# Patient Record
Sex: Female | Born: 1975 | Race: Black or African American | Hispanic: No | Marital: Single | Smoking: Never smoker
Health system: Southern US, Community
[De-identification: ages and names within clinical notes are randomized; demographics above are authoritative.]

## PROBLEM LIST (undated history)

## (undated) ENCOUNTER — Inpatient Hospital Stay (HOSPITAL_COMMUNITY): Payer: Self-pay

## (undated) DIAGNOSIS — Z789 Other specified health status: Secondary | ICD-10-CM

## (undated) HISTORY — PX: NO PAST SURGERIES: SHX2092

---

## 2001-04-11 ENCOUNTER — Other Ambulatory Visit: Admission: RE | Admit: 2001-04-11 | Discharge: 2001-04-11 | Payer: Self-pay | Admitting: Obstetrics and Gynecology

## 2001-11-17 ENCOUNTER — Encounter: Payer: Self-pay | Admitting: Obstetrics and Gynecology

## 2001-11-17 ENCOUNTER — Ambulatory Visit (HOSPITAL_COMMUNITY): Admission: RE | Admit: 2001-11-17 | Discharge: 2001-11-17 | Payer: Self-pay | Admitting: Obstetrics and Gynecology

## 2002-02-02 ENCOUNTER — Encounter: Payer: Self-pay | Admitting: Obstetrics and Gynecology

## 2002-02-02 ENCOUNTER — Ambulatory Visit (HOSPITAL_COMMUNITY): Admission: RE | Admit: 2002-02-02 | Discharge: 2002-02-02 | Payer: Self-pay | Admitting: Obstetrics and Gynecology

## 2002-02-21 ENCOUNTER — Inpatient Hospital Stay (HOSPITAL_COMMUNITY): Admission: AD | Admit: 2002-02-21 | Discharge: 2002-02-21 | Payer: Self-pay | Admitting: Obstetrics and Gynecology

## 2002-03-14 ENCOUNTER — Inpatient Hospital Stay (HOSPITAL_COMMUNITY): Admission: AD | Admit: 2002-03-14 | Discharge: 2002-03-16 | Payer: Self-pay | Admitting: Obstetrics and Gynecology

## 2002-04-03 ENCOUNTER — Encounter: Admission: RE | Admit: 2002-04-03 | Discharge: 2002-05-03 | Payer: Self-pay | Admitting: Obstetrics and Gynecology

## 2002-05-11 ENCOUNTER — Other Ambulatory Visit: Admission: RE | Admit: 2002-05-11 | Discharge: 2002-05-11 | Payer: Self-pay | Admitting: Obstetrics and Gynecology

## 2003-01-15 ENCOUNTER — Emergency Department (HOSPITAL_COMMUNITY): Admission: EM | Admit: 2003-01-15 | Discharge: 2003-01-15 | Payer: Self-pay

## 2004-02-28 ENCOUNTER — Inpatient Hospital Stay (HOSPITAL_COMMUNITY): Admission: AD | Admit: 2004-02-28 | Discharge: 2004-02-28 | Payer: Self-pay | Admitting: Obstetrics & Gynecology

## 2005-09-09 ENCOUNTER — Ambulatory Visit: Payer: Self-pay | Admitting: Internal Medicine

## 2005-10-29 ENCOUNTER — Ambulatory Visit: Payer: Self-pay | Admitting: Family Medicine

## 2005-11-05 ENCOUNTER — Encounter: Payer: Self-pay | Admitting: Family Medicine

## 2005-11-05 ENCOUNTER — Ambulatory Visit: Payer: Self-pay | Admitting: Family Medicine

## 2007-01-17 ENCOUNTER — Encounter (INDEPENDENT_AMBULATORY_CARE_PROVIDER_SITE_OTHER): Payer: Self-pay | Admitting: Family Medicine

## 2007-01-17 ENCOUNTER — Ambulatory Visit: Payer: Self-pay | Admitting: Family Medicine

## 2007-01-17 DIAGNOSIS — N72 Inflammatory disease of cervix uteri: Secondary | ICD-10-CM | POA: Insufficient documentation

## 2007-01-18 ENCOUNTER — Ambulatory Visit: Payer: Self-pay | Admitting: Family Medicine

## 2007-04-11 ENCOUNTER — Ambulatory Visit: Payer: Self-pay | Admitting: Family Medicine

## 2007-06-02 ENCOUNTER — Ambulatory Visit: Payer: Self-pay | Admitting: Family Medicine

## 2007-06-28 ENCOUNTER — Encounter (INDEPENDENT_AMBULATORY_CARE_PROVIDER_SITE_OTHER): Payer: Self-pay | Admitting: *Deleted

## 2007-07-18 ENCOUNTER — Ambulatory Visit: Payer: Self-pay | Admitting: Family Medicine

## 2007-07-18 LAB — CONVERTED CEMR LAB: Preg, Serum: NEGATIVE

## 2007-07-19 ENCOUNTER — Ambulatory Visit: Payer: Self-pay | Admitting: Family Medicine

## 2007-10-02 ENCOUNTER — Encounter (INDEPENDENT_AMBULATORY_CARE_PROVIDER_SITE_OTHER): Payer: Self-pay | Admitting: Family Medicine

## 2007-10-10 ENCOUNTER — Ambulatory Visit: Payer: Self-pay | Admitting: Family Medicine

## 2008-01-01 ENCOUNTER — Ambulatory Visit: Payer: Self-pay | Admitting: Nurse Practitioner

## 2008-02-19 ENCOUNTER — Encounter: Admission: RE | Admit: 2008-02-19 | Discharge: 2008-02-19 | Payer: Self-pay | Admitting: Occupational Medicine

## 2008-03-25 ENCOUNTER — Ambulatory Visit: Payer: Self-pay | Admitting: Family Medicine

## 2008-03-25 ENCOUNTER — Encounter (INDEPENDENT_AMBULATORY_CARE_PROVIDER_SITE_OTHER): Payer: Self-pay | Admitting: Family Medicine

## 2008-03-25 LAB — CONVERTED CEMR LAB
Bilirubin Urine: NEGATIVE
Blood in Urine, dipstick: NEGATIVE
Glucose, Urine, Semiquant: NEGATIVE
Ketones, urine, test strip: NEGATIVE
Nitrite: NEGATIVE
Protein, U semiquant: 30
Specific Gravity, Urine: >=1.03
Urobilinogen, UA: 1
WBC Urine, dipstick: NEGATIVE
pH: 6

## 2008-04-03 ENCOUNTER — Encounter (INDEPENDENT_AMBULATORY_CARE_PROVIDER_SITE_OTHER): Payer: Self-pay | Admitting: Family Medicine

## 2008-07-01 ENCOUNTER — Ambulatory Visit: Payer: Self-pay | Admitting: Family Medicine

## 2008-07-01 LAB — CONVERTED CEMR LAB: Preg, Serum: NEGATIVE

## 2008-07-02 ENCOUNTER — Ambulatory Visit: Payer: Self-pay | Admitting: Family Medicine

## 2008-09-23 ENCOUNTER — Ambulatory Visit: Payer: Self-pay | Admitting: Family Medicine

## 2008-12-20 ENCOUNTER — Ambulatory Visit: Payer: Self-pay | Admitting: Nurse Practitioner

## 2008-12-20 LAB — CONVERTED CEMR LAB: Beta hcg, urine, semiquantitative: NEGATIVE

## 2009-03-31 ENCOUNTER — Ambulatory Visit: Payer: Self-pay | Admitting: Nurse Practitioner

## 2009-03-31 ENCOUNTER — Encounter (INDEPENDENT_AMBULATORY_CARE_PROVIDER_SITE_OTHER): Payer: Self-pay | Admitting: Family Medicine

## 2009-03-31 LAB — CONVERTED CEMR LAB: Preg, Serum: NEGATIVE

## 2009-04-01 ENCOUNTER — Ambulatory Visit: Payer: Self-pay | Admitting: Nurse Practitioner

## 2009-06-23 ENCOUNTER — Ambulatory Visit: Payer: Self-pay | Admitting: Internal Medicine

## 2009-09-15 ENCOUNTER — Ambulatory Visit: Payer: Self-pay | Admitting: Physician Assistant

## 2009-12-08 ENCOUNTER — Ambulatory Visit: Payer: Self-pay | Admitting: Physician Assistant

## 2010-03-02 ENCOUNTER — Ambulatory Visit: Payer: Self-pay | Admitting: Nurse Practitioner

## 2010-03-02 DIAGNOSIS — E6609 Other obesity due to excess calories: Secondary | ICD-10-CM | POA: Insufficient documentation

## 2010-03-02 DIAGNOSIS — E669 Obesity, unspecified: Secondary | ICD-10-CM

## 2010-03-02 LAB — CONVERTED CEMR LAB
ALT: 17 units/L (ref 0–35)
AST: 16 units/L (ref 0–37)
Albumin: 4.3 g/dL (ref 3.5–5.2)
Alkaline Phosphatase: 93 units/L (ref 39–117)
BUN: 17 mg/dL (ref 6–23)
Basophils Absolute: 0.1 10*3/uL (ref 0.0–0.1)
Basophils Relative: 1 % (ref 0–1)
Bilirubin Urine: NEGATIVE
CO2: 27 meq/L (ref 19–32)
Calcium: 9.2 mg/dL (ref 8.4–10.5)
Chlamydia, DNA Probe: NEGATIVE
Chloride: 104 meq/L (ref 96–112)
Creatinine, Ser: 1.05 mg/dL (ref 0.40–1.20)
Eosinophils Absolute: 0.1 10*3/uL (ref 0.0–0.7)
Eosinophils Relative: 3 % (ref 0–5)
GC Probe Amp, Genital: NEGATIVE
Glucose, Bld: 79 mg/dL (ref 70–99)
Glucose, Urine, Semiquant: NEGATIVE
HCT: 41.9 % (ref 36.0–46.0)
Hemoglobin: 13.6 g/dL (ref 12.0–15.0)
KOH Prep: NEGATIVE
Ketones, urine, test strip: NEGATIVE
Lymphocytes Relative: 45 % (ref 12–46)
Lymphs Abs: 2.2 10*3/uL (ref 0.7–4.0)
MCHC: 32.5 g/dL (ref 30.0–36.0)
MCV: 94.8 fL (ref 78.0–100.0)
Monocytes Absolute: 0.3 10*3/uL (ref 0.1–1.0)
Monocytes Relative: 6 % (ref 3–12)
Neutro Abs: 2.2 10*3/uL (ref 1.7–7.7)
Neutrophils Relative %: 45 % (ref 43–77)
Nitrite: NEGATIVE
Platelets: 343 10*3/uL (ref 150–400)
Potassium: 4.6 meq/L (ref 3.5–5.3)
Protein, U semiquant: NEGATIVE
RBC: 4.42 M/uL (ref 3.87–5.11)
RDW: 13.1 % (ref 11.5–15.5)
Rapid HIV Screen: NEGATIVE
Sodium: 140 meq/L (ref 135–145)
Specific Gravity, Urine: 1.015
TSH: 3.201 microintl units/mL (ref 0.350–4.500)
Total Bilirubin: 0.4 mg/dL (ref 0.3–1.2)
Total Protein: 6.7 g/dL (ref 6.0–8.3)
Urobilinogen, UA: 1
WBC Urine, dipstick: NEGATIVE
WBC: 4.8 10*3/uL (ref 4.0–10.5)
pH: 7

## 2010-03-03 ENCOUNTER — Encounter (INDEPENDENT_AMBULATORY_CARE_PROVIDER_SITE_OTHER): Payer: Self-pay | Admitting: Nurse Practitioner

## 2010-03-10 LAB — CONVERTED CEMR LAB: Pap Smear: NEGATIVE

## 2010-03-20 ENCOUNTER — Ambulatory Visit: Payer: Self-pay | Admitting: Nurse Practitioner

## 2010-03-20 LAB — CONVERTED CEMR LAB
LDL Cholesterol: 131 mg/dL — ABNORMAL HIGH (ref 0–99)
Triglycerides: 42 mg/dL (ref <150)

## 2010-03-23 ENCOUNTER — Encounter (INDEPENDENT_AMBULATORY_CARE_PROVIDER_SITE_OTHER): Payer: Self-pay | Admitting: Nurse Practitioner

## 2010-05-25 ENCOUNTER — Encounter (INDEPENDENT_AMBULATORY_CARE_PROVIDER_SITE_OTHER): Payer: Self-pay | Admitting: *Deleted

## 2010-05-25 ENCOUNTER — Telehealth (INDEPENDENT_AMBULATORY_CARE_PROVIDER_SITE_OTHER): Payer: Self-pay | Admitting: *Deleted

## 2010-06-24 ENCOUNTER — Telehealth (INDEPENDENT_AMBULATORY_CARE_PROVIDER_SITE_OTHER): Payer: Self-pay | Admitting: Nurse Practitioner

## 2010-06-30 ENCOUNTER — Encounter (INDEPENDENT_AMBULATORY_CARE_PROVIDER_SITE_OTHER): Payer: Self-pay | Admitting: Internal Medicine

## 2010-08-06 ENCOUNTER — Ambulatory Visit: Payer: Self-pay | Admitting: Nurse Practitioner

## 2010-08-06 LAB — CONVERTED CEMR LAB: Preg, Serum: NEGATIVE

## 2010-08-07 ENCOUNTER — Ambulatory Visit: Payer: Self-pay | Admitting: Nurse Practitioner

## 2010-10-30 ENCOUNTER — Ambulatory Visit
Admission: RE | Admit: 2010-10-30 | Discharge: 2010-10-30 | Payer: Self-pay | Source: Home / Self Care | Attending: Nurse Practitioner | Admitting: Nurse Practitioner

## 2010-11-10 NOTE — Letter (Signed)
Summary: Lipid Letter  [facility]  [address] [place], [place] [address]   Phone: [phone]  Fax: [phone]    03/23/2010  Janet Garrison [address] [place], [place]  [address]  Dear Debbora Presto:  We have carefully reviewed your last lipid profile from 03/20/2010 and the results are noted below with a summary of recommendations for lipid management.    Cholesterol:       175     Goal: less than 200   HDL "good" Cholesterol:   36     Goal: greater than 40   LDL "bad" Cholesterol:   131     Goal: less than 130   Triglycerides:       42     Goal: less than 150  Cholesterol labs ok at this time.    Current Medications: 1)    Naprosyn 500 Mg  Tabs (Naproxen) .... As needed 2)    Depo-provera 150 Mg/ml Im Susp (Medroxyprogesterone acetate)  If you have any questions, please call. We appreciate being able to work with you.   Sincerely,    [facility] Lehman Prom FNP

## 2010-11-10 NOTE — Progress Notes (Signed)
Summary: Office Visit//DEPRESSION SCREENING  Office Visit//DEPRESSION SCREENING   Imported By: Arta Bruce 04/20/2010 11:55:50  _____________________________________________________________________  External Attachment:    Type:   Image     Comment:   External Document

## 2010-11-10 NOTE — Letter (Signed)
Summary: *HSN Results Follow up  [facility]  [address] [place], [place] [address]   Phone: [phone]  Fax: [phone]      03/03/2010   [facility] [address] [place], [place]  [address]   Dear  Ms. GRENDELL Sterry,                            ____S.Drinkard,FNP   ____D. Gore,FNP       ____B. McPherson,MD   ____V. Rankins,MD    ____E. Mulberry,MD    _X___N. Daphine Deutscher, FNP  ____D. Reche Dixon, MD    ____K. Philipp Deputy, MD    ____Other     This letter is to inform you that your recent test(s):  ___X____Pap Smear    __X_____Lab Test     _______X-ray    ____X___ is within acceptable limits  _______ requires a medication change  _______ requires a follow-up lab visit  _______ requires a follow-up visit with your provider   Comments: Labs done during recent office visit are normal.  Pap Smear results ______________________________.       _________________________________________________________ If you have any questions, please contact our office [phone].                    Sincerely,    Lehman Prom FNP [facility]

## 2010-11-10 NOTE — Assessment & Plan Note (Signed)
Summary: DEPO-PROVERA////RJP   Nurse Visit   Allergies: No Known Drug Allergies  Medication Administration  Injection # 1:    Medication: Depo-Provera 150mg     Diagnosis: CONTRACEPTIVE MANAGEMENT (ICD-V25.09)    Route: IM    Site: L deltoid    Exp Date: 03/10/2012    Lot #: [id number]    Mfr: Pharmacia    Comments: [phone] Next shot due 03/02/10    Patient tolerated injection without complications    Given by: Armenia Shannon (December 08, 2009 2:49 PM)  Orders Added: 1)  Est. Patient Nurse visit [09003] 2)  Depo-Provera 150mg  [J1055] 3)  Admin of Therapeutic Inj  intramuscular or subcutaneous [96372]   Medication Administration  Injection # 1:    Medication: Depo-Provera 150mg     Diagnosis: CONTRACEPTIVE MANAGEMENT (ICD-V25.09)    Route: IM    Site: L deltoid    Exp Date: 03/10/2012    Lot #: [id number]    Mfr: Pharmacia    Comments: [phone] Next shot due 03/02/10    Patient tolerated injection without complications    Given by: Armenia Shannon (December 08, 2009 2:49 PM)  Orders Added: 1)  Est. Patient Nurse visit [09003] 2)  Depo-Provera 150mg  [J1055] 3)  Admin of Therapeutic Inj  intramuscular or subcutaneous [30865]

## 2010-11-10 NOTE — Letter (Signed)
Summary: Generic Letter  [facility]  [address] [place], [place] [address]   Phone: [phone]  Fax: [phone]        06/30/2010  [facility] [address] [place], [place]  [address]  Dear Ms. Bordeaux,  We have been unable to contact you by telephone.  Please contact our office, at your earliest convenience, so that we may speak with you.  Sincerely,   Dutch Quint RN

## 2010-11-10 NOTE — Progress Notes (Signed)
Summary: Wants information on becoming a surrogate mother  Phone Note Call from Patient Call back at [phone]   Caller: Patient Reason for Call: Talk to Doctor Summary of Call: PT WANTS TO SPEAK TO  DOCTOR ABOUT BECOMING A SERAGENT MOTHER.   Initial call taken by: Oscar La,  June 24, 2010 11:20 AM  Follow-up for Phone Call        [facility] for add'l info Michelle Nasuti  June 24, 2010 12:37 PM   Left message on answering machine for pt. to return call.  Dutch Quint RN  June 25, 2010 12:40 PM  Left message on answering machine for pt. to return call.  Dutch Quint RN  June 29, 2010 12:34 PM  Letter sent.  Dutch Quint RN  June 30, 2010 3:54 PM

## 2010-11-10 NOTE — Assessment & Plan Note (Signed)
Summary: Complete Physical Exam   Vital Signs:  Patient profile:   35 year old female Menstrual status:  DEPO Weight:      189.7 pounds BMI:     35.97 BSA:     1.85 Temp:     98.0 degrees F oral Pulse rate:   91 / minute Pulse rhythm:   regular Resp:     20 per minute BP sitting:   131 / 87  (left arm) Cuff size:   regular  Vitals Entered By: Levon Hedger (Mar 02, 2010 10:55 AM) CC: CPP Is Patient Diabetic? No Pain Assessment Patient in pain? no       Does patient need assistance? Functional Status Self care Ambulation Normal     Menstrual Status DEPO Last PAP Result normal   CC:  CPP.  History of Present Illness:  Pt into the office for a complete physical exam  PAP - last done 2 years ago.  no family history of cervical cancer. currently on depoprovera - been on in the past 8 years Denies any smoking No elevation in BP some weight gain Menses - only minimal spotting but no real menses 68 child - 46 years old and pt is not interested in any other children  Mammogram - no family history of breast cancer never had mammogram  Optho - no glasses or contacts  Dental - dental work done in 11/2009 - fillings.  She has another appt in July for more fillings.  tdap - up to date.  Obesity - pt has concerns about her weight. Pt does know that her weight is of concern.   No meaningful exercise  Habits & Providers  Alcohol-Tobacco-Diet     Alcohol drinks/day: 0     Tobacco Status: never  Exercise-Depression-Behavior     Does Patient Exercise: no     Have you felt down or hopeless? no     Have you felt little pleasure in things? no     Depression Counseling: not indicated; screening negative for depression     Drug Use: no     Seat Belt Use: 75     Sun Exposure: occasionally  Comments: PHQ- 9 score = 8  Allergies (verified): No Known Drug Allergies  Social History: Does Patient Exercise:  no  Review of Systems General:  Denies loss of  appetite. Eyes:  Denies blurring. ENT:  Denies hoarseness. CV:  Denies chest pain or discomfort. Resp:  Denies cough. GI:  Denies abdominal pain, nausea, and vomiting. GU:  Denies discharge. MS:  Denies joint pain. Derm:  Denies rash. Neuro:  Denies headaches. Psych:  Denies anxiety and depression. Endo:  Denies excessive thirst and excessive urination.  Physical Exam  General:  alert.   Head:  normocephalic.   Eyes:  pupils round and pupils reactive to light.   Ears:  R ear normal and L ear normal.   Nose:  no nasal discharge.   Mouth:  pharynx pink and moist.   Neck:  supple.   Chest Wall:  no mass.   Breasts:  no abnormal thickening.  pendulous Lungs:  normal breath sounds.   Heart:  normal rate and regular rhythm.   Abdomen:  soft, non-tender, and normal bowel sounds.  straie Rectal:  no external abnormalities.   Genitalia:  normal introitus.   Msk:  up to the exam table Extremities:  no edema Neurologic:  alert & oriented X3.   Skin:  color normal.   Psych:  Oriented X3.  Pelvic Exam  Vulva:      normal appearance.   Urethra and Bladder:      Urethra--no discharge.   Vagina:      physiologic discharge.   Cervix:      midposition, parous.   Uterus:      smooth.   Adnexa:      nontender bilaterally.   Rectum:      normal.      Impression & Recommendations:  Problem # 1:  ROUTINE GYNECOLOGICAL EXAMINATION (ICD-V72.31) tdap up to date rec optho exam Keep dental exam labs done except lipids as pt is not fasting PAP done PHQ 9 score = 8 Orders: UA Dipstick w/o Micro (manual) (16606) KOH/ WET Mount [phone]) Pap Smear, Thin Prep ( Collection of) [phone]) T- GC Chlamydia (35573) T-TSH [phone]) Rapid HIV  (23762) T-Comprehensive Metabolic Panel (83151-76160) T-CBC w/Diff ([address]10-62694)  Problem # 2:  CONTRACEPTIVE MANAGEMENT (ICD-V25.09)  will give depoprovera today spoke with pt about long term effects of depoprovera and she needs to change  to something more long term  Orders: Admin of Therapeutic Inj  intramuscular or subcutaneous (85462) Depo-Provera 150mg  (J1055)  Problem # 3:  OBESITY (ICD-278.00) spoke with pt about weight loss  Complete Medication List: 1)  Naprosyn 500 Mg Tabs (Naproxen) .... As needed 2)  Depo-provera 150 Mg/ml Im Susp (Medroxyprogesterone acetate)  Patient Instructions: 1)  You should keep a food diary and document what foods you eat daily for meals and snacks. 2)  You should contact the health department regarding long term family planning 3)  Depoprovera will be due again August 14th if you decide to continue 4)  Follow up as needed. 5)  Schedule a lab appointment for lipids (fasting) in next 2-3 weeks  Laboratory Results   Urine Tests  Date/Time Received: Mar 02, 2010 11:16 AM   Routine Urinalysis   Color: lt. yellow Appearance: Clear Glucose: negative   (Normal Range: Negative) Bilirubin: negative   (Normal Range: Negative) Ketone: negative   (Normal Range: Negative) Spec. Gravity: 1.015   (Normal Range: 1.003-1.035) Blood: small   (Normal Range: Negative) pH: 7.0   (Normal Range: 5.0-8.0) Protein: negative   (Normal Range: Negative) Urobilinogen: 1.0   (Normal Range: 0-1) Nitrite: negative   (Normal Range: Negative) Leukocyte Esterace: negative   (Normal Range: Negative)    Date/Time Received: Mar 02, 2010 12:12 PM   Wet Mount/KOH Source: vaginal WBC/hpf: 1-5 Bacteria/hpf: 1+ Clue cells/hpf: none Yeast/hpf: none Trichomonas/hpf: none  Other Tests  Rapid HIV: negative    Laboratory Results   Urine Tests    Routine Urinalysis   Color: lt. yellow Appearance: Clear Glucose: negative   (Normal Range: Negative) Bilirubin: negative   (Normal Range: Negative) Ketone: negative   (Normal Range: Negative) Spec. Gravity: 1.015   (Normal Range: 1.003-1.035) Blood: small   (Normal Range: Negative) pH: 7.0   (Normal Range: 5.0-8.0) Protein: negative   (Normal  Range: Negative) Urobilinogen: 1.0   (Normal Range: 0-1) Nitrite: negative   (Normal Range: Negative) Leukocyte Esterace: negative   (Normal Range: Negative)      Wet Mount Wet Mount KOH: Negative  Other Tests  Rapid HIV: negative    Medication Administration  Injection # 1:    Medication: Depo-Provera 150mg     Diagnosis: CONTRACEPTIVE MANAGEMENT (ICD-V25.09)    Route: IM    Site: LUOQ gluteus    Exp Date: 02/2012    Lot #: [id number]    Mfr: Pharmacia    Patient tolerated  injection without complications    Given by: Levon Hedger (Mar 02, 2010 12:51 PM)  Orders Added: 1)  Est. Patient age 56-39 [38] 2)  UA Dipstick w/o Micro (manual) [81002] 3)  KOH/ WET Mount [87210] 4)  Pap Smear, Thin Prep ( Collection of) [Q0091] 5)  T- GC Chlamydia [47425] 6)  T-TSH [95638-75643] 7)  Rapid HIV  [92370] 8)  T-Comprehensive Metabolic Panel [80053-22900] 9)  T-CBC w/Diff [32951-88416] 10)  Admin of Therapeutic Inj  intramuscular or subcutaneous [96372] 11)  Depo-Provera 150mg  [J1055]

## 2010-11-10 NOTE — Progress Notes (Signed)
Summary: DEPO INJECTION   Phone Note Refill Request   Refills Requested: Medication #1:  DEPO-PROVERA 150 MG/ML IM SUSP. PLEASE, CALL DEPO TO [facility] AND Wynelle Link, CALL THE PT [phone]  Initial call taken by: Cheryll Dessert,  May 25, 2010 8:27 AM  Follow-up for Phone Call        Rx faxed to [facility]. Follow-up by: Vesta Mixer CMA,  May 25, 2010 8:32 AM    Prescriptions: DEPO-PROVERA 150 MG/ML IM SUSP (MEDROXYPROGESTERONE ACETATE)   #1 x 2   Entered by:   Vesta Mixer CMA   Authorized by:   Lehman Prom FNP   Signed by:   Vesta Mixer CMA on 05/25/2010   Method used:   Faxed to ...       [facility] (retail)       [address] [place], [place]  [address]       Ph: [phone]       Fax: [phone]   RxID:   [id number]

## 2011-02-26 NOTE — Discharge Summary (Signed)
[facility]  Patient:    Janet Garrison, Janet Garrison Visit Number: [id number] MRN: [id number]          Service Type: OBS Location: [id number] Attending Physician:  Oliver Pila Dictated by:   Alvino Chapel, M.D. Admit Date:  03/14/2002 Discharge Date: 03/16/2002                             Discharge Summary  DISCHARGE DIAGNOSES:          1. Preterm pregnancy at 34 5/7 weeks delivered.                               2. Preterm rupture of membranes.                               3. Status post normal spontaneous vaginal                                  delivery.  DISCHARGE MEDICATIONS:        1. Motrin 600 mg p.o. every 6h p.r.n.                               2. Percocet 1-2 tablets p.o. every 4h p.r.n.  DISCHARGE FOLLOW-UP:          The patient is to follow-up in six weeks for routine postpartum exam.  HOSPITAL COURSE:              The patient is a 35 year old, G1, P0 who was admitted at 46 5/7 weeks after she presented to the office with spontaneous rupture of membranes. That day, she was positive nitrazine and positive fern and had begun to spontaneously contract. The patient changed from 90% effaced and 1 cm to completely effaced and 4 cm while being observed in the hospital. The patient received Stadol and Phenergan upon admission. Prenatal care had been complicated by multiple UTIs and the patient was on suppression with Keflex daily; otherwise, care had been uncomplicated. Prenatal labs are as follows: B+, antibody negative, sickle negative, RPR negative, rubella immune, hepatitis B surface antigen negative, HIV negative, GC negative, chlamydia negative. Triple screen normal, one hour glucola 93 and group B strep unknown.  PAST OB HISTORY:              None.  PAST SURGICAL HISTORY:        None.  PAST GYN HISTORY:             No abnormal Pap smear.  PAST MEDICAL HISTORY:         None.  ALLERGIES:                     None.  MEDICATIONS:                  Keflex suppression and prenatal vitamins.  On admission, the patient was afebrile with stable vital signs. Fetal heart rate was reactive. She was gravid and nontender. Cervix as stated had reached complete effacement, 4 cm and a zero station. She was placed on ampicillin 2 gm for her preterm status and unknown group B strep status. The patient then progressed quickly to  complete dilation and was brought to labor and delivery where she pushed and delivered a normal spontaneous vaginal delivery of a vigorous female infant over an intact perineum. Apgars were 9 and 10, weight was 4 pounds 3 ounces. The placenta delivered spontaneously. Estimated blood loss was 300. Pediatrics was present at delivery and assessed the baby who was doing well immediately after birth. The patient was then admitted for routine postpartum care and did well. On postoperative day #2, she was afebrile with stable vital signs, fundus was firm, her pain was well controlled. The baby had been transferred to the NICU for issues with jaundice and ruling out septicemia; however, was stable. At this point, the mother was felt stable for discharge and was discharged to home with follow-up as previously stated. Dictated by:   Alvino Chapel, M.D. Attending Physician:  Oliver Pila DD:  03/30/02 TD:  04/02/02 Job: [id number]

## 2011-08-26 ENCOUNTER — Ambulatory Visit (HOSPITAL_COMMUNITY)
Admission: RE | Admit: 2011-08-26 | Discharge: 2011-08-26 | Disposition: A | Discharge status: Home / Self Care | Payer: Self-pay | Source: Ambulatory Visit | Attending: Internal Medicine | Admitting: Internal Medicine

## 2011-08-26 ENCOUNTER — Other Ambulatory Visit: Payer: Self-pay | Admitting: Internal Medicine

## 2011-08-26 DIAGNOSIS — M79609 Pain in unspecified limb: Secondary | ICD-10-CM | POA: Insufficient documentation

## 2011-08-26 DIAGNOSIS — R52 Pain, unspecified: Secondary | ICD-10-CM

## 2013-03-26 ENCOUNTER — Inpatient Hospital Stay (HOSPITAL_COMMUNITY)
Admission: AD | Admit: 2013-03-26 | Discharge: 2013-03-26 | Disposition: A | Discharge status: Home / Self Care | Payer: Self-pay | Source: Ambulatory Visit | Attending: Obstetrics & Gynecology | Admitting: Obstetrics & Gynecology

## 2013-03-26 ENCOUNTER — Encounter (HOSPITAL_COMMUNITY): Payer: Self-pay | Admitting: *Deleted

## 2013-03-26 ENCOUNTER — Inpatient Hospital Stay (HOSPITAL_COMMUNITY): Payer: Self-pay

## 2013-03-26 DIAGNOSIS — R109 Unspecified abdominal pain: Secondary | ICD-10-CM | POA: Insufficient documentation

## 2013-03-26 DIAGNOSIS — O021 Missed abortion: Secondary | ICD-10-CM | POA: Insufficient documentation

## 2013-03-26 HISTORY — DX: Other specified health status: Z78.9

## 2013-03-26 LAB — WET PREP, GENITAL
Clue Cells Wet Prep HPF POC: NONE SEEN
WBC, Wet Prep HPF POC: NONE SEEN
Yeast Wet Prep HPF POC: NONE SEEN

## 2013-03-26 LAB — URINE MICROSCOPIC-ADD ON

## 2013-03-26 LAB — URINALYSIS, ROUTINE W REFLEX MICROSCOPIC
Glucose, UA: NEGATIVE mg/dL
Protein, ur: NEGATIVE mg/dL

## 2013-03-26 LAB — HCG, QUANTITATIVE, PREGNANCY: hCG, Beta Chain, Quant, S: 7777 m[IU]/mL — ABNORMAL HIGH (ref <5)

## 2013-03-26 LAB — CBC
HCT: 36.6 % (ref 36.0–46.0)
Hemoglobin: 12.3 g/dL (ref 12.0–15.0)
MCHC: 33.6 g/dL (ref 30.0–36.0)

## 2013-03-26 LAB — POCT PREGNANCY, URINE: Preg Test, Ur: POSITIVE — AB

## 2013-03-26 MED ORDER — OXYCODONE-ACETAMINOPHEN 5-325 MG PO TABS
2.0000 | ORAL_TABLET | Freq: Once | ORAL | Status: AC
  Administered 2013-03-26: 2 via ORAL
  Filled 2013-03-26: qty 2

## 2013-03-26 MED ORDER — OXYCODONE-ACETAMINOPHEN 5-325 MG PO TABS: 1.0000 | ORAL_TABLET | ORAL | Status: DC | PRN

## 2013-03-26 NOTE — MAU Note (Signed)
Pt reports she has been bleeding x 6 days and today had an episode of heavy bleeding. LMP prior to this bleeding was 01/01/2013. Has not done a pregnancy test. Was on Depo until 05/2012.lower abd cramping since bleeding started.

## 2013-03-26 NOTE — MAU Provider Note (Signed)
History     CSN: [id number]  Arrival date and time: 03/26/13 1859   First Provider Initiated Contact with Patient 03/26/13 2021      Chief Complaint  Patient presents with  . Vaginal Bleeding  . Abdominal Pain   HPI  Janet Garrison is a 37 y.o. [id number] who presents today with vaginal bleeding and cramping. She states that the bleeding and cramping started on Wednesday, and that she has been passing lemon sized clots and "maybe" some tissue.   Past Medical History  Diagnosis Date  . Medical history non-contributory     Past Surgical History  Procedure Laterality Date  . No past surgeries      Family History  Problem Relation Age of Onset  . Hypertension Mother     History  Substance Use Topics  . Smoking status: Passive Smoke Exposure - Never Smoker  . Smokeless tobacco: Not on file  . Alcohol Use: Yes     Comment: occasional    Allergies: No Known Allergies  Prescriptions prior to admission  Medication Sig Dispense Refill  . naproxen sodium (ALEVE) 220 MG tablet Take 660 mg by mouth daily as needed (For headache.).        Review of Systems  Constitutional: Negative for fever.  Eyes: Negative for blurred vision.  Respiratory: Negative for shortness of breath.   Cardiovascular: Negative for chest pain.  Gastrointestinal: Positive for abdominal pain. Negative for nausea, vomiting, diarrhea and constipation.  Genitourinary: Negative for dysuria, urgency and frequency.  Musculoskeletal: Negative for myalgias.  Neurological: Negative for dizziness and headaches.   Physical Exam   Blood pressure 138/76, pulse 75, temperature 98.1 F (36.7 C), temperature source Oral, resp. rate 18, height 4\' 11"  (1.499 m), weight 85.73 kg (189 lb), last menstrual period 01/01/2013, SpO2 99.00%.  Physical Exam  Nursing note and vitals reviewed. Constitutional: She is oriented to person, place, and time. She appears well-developed and well-nourished. No distress.   Cardiovascular: Normal rate.   Respiratory: Effort normal.  GI: Soft. There is no tenderness.  Genitourinary:   External: no lesion Vagina: large amount of BRB with clots  Cervix: small, quarter sized clot expressed from cervix. No CMT, cervix slightly dilated Uterus: not enlarged.   Neurological: She is alert and oriented to person, place, and time.  Skin: Skin is warm and dry.  Psychiatric: She has a normal mood and affect.    MAU Course  Procedures  Results for orders placed during the hospital encounter of 03/26/13 (from the past 24 hour(s))  URINALYSIS, ROUTINE W REFLEX MICROSCOPIC     Status: Abnormal   Collection Time    03/26/13  7:20 PM      Result Value Range   Color, Urine ORANGE (*) YELLOW   APPearance HAZY (*) CLEAR   Specific Gravity, Urine >1.030 (*) 1.005 - 1.030   pH 6.0  5.0 - 8.0   Glucose, UA NEGATIVE  NEGATIVE mg/dL   Hgb urine dipstick LARGE (*) NEGATIVE   Bilirubin Urine NEGATIVE  NEGATIVE   Ketones, ur NEGATIVE  NEGATIVE mg/dL   Protein, ur NEGATIVE  NEGATIVE mg/dL   Urobilinogen, UA 0.2  0.0 - 1.0 mg/dL   Nitrite NEGATIVE  NEGATIVE   Leukocytes, UA NEGATIVE  NEGATIVE  URINE MICROSCOPIC-ADD ON     Status: None   Collection Time    03/26/13  7:20 PM      Result Value Range   Squamous Epithelial / LPF RARE  RARE   WBC, UA  0-2  <3 WBC/hpf   RBC / HPF TOO NUMEROUS TO COUNT  <3 RBC/hpf   Bacteria, UA RARE  RARE   Urine-Other MUCOUS PRESENT    POCT PREGNANCY, URINE     Status: Abnormal   Collection Time    03/26/13  7:28 PM      Result Value Range   Preg Test, Ur POSITIVE (*) NEGATIVE  CBC     Status: None   Collection Time    03/26/13  7:55 PM      Result Value Range   WBC 5.1  4.0 - 10.5 K/uL   RBC 4.01  3.87 - 5.11 MIL/uL   Hemoglobin 12.3  12.0 - 15.0 g/dL   HCT 16.1  09.6 - 04.5 %   MCV 91.3  78.0 - 100.0 fL   MCH 30.7  26.0 - 34.0 pg   MCHC 33.6  30.0 - 36.0 g/dL   RDW 40.9  81.1 - 91.4 %   Platelets 320  150 - 400 K/uL  ABO/RH      Status: None   Collection Time    03/26/13  7:55 PM      Result Value Range   ABO/RH(D) B POS    HCG, QUANTITATIVE, PREGNANCY     Status: Abnormal   Collection Time    03/26/13  7:55 PM      Result Value Range   hCG, Beta Chain, Quant, S 7777 (*) <5 mIU/mL  WET PREP, GENITAL     Status: None   Collection Time    03/26/13  8:20 PM      Result Value Range   Yeast Wet Prep HPF POC NONE SEEN  NONE SEEN   Trich, Wet Prep NONE SEEN  NONE SEEN   Clue Cells Wet Prep HPF POC NONE SEEN  NONE SEEN   WBC, Wet Prep HPF POC NONE SEEN  NONE SEEN    USE: shows irregular gestational sac with no fetal pole. See report  Assessment and Plan   1. Abortion, missed    Bleeding precautions reviewed RX: percocet 5/325 #15, 0rf Return in 48 hours for repeat HCG if falling consider cytotec as needed.  Tawnya Crook 03/26/2013, 8:22 PM

## 2013-03-27 NOTE — MAU Provider Note (Signed)
Attestation of Attending Supervision of Advanced Practitioner (CNM/NP): Evaluation and management procedures were performed by the Advanced Practitioner under my supervision and collaboration.  I have reviewed the Advanced Practitioner's note and chart, and I agree with the management and plan.  HARRAWAY-SMITH, Erynne Kealey 6:31 AM

## 2013-03-28 ENCOUNTER — Inpatient Hospital Stay (HOSPITAL_COMMUNITY)
Admission: AD | Admit: 2013-03-28 | Discharge: 2013-03-28 | Disposition: A | Discharge status: Home / Self Care | Payer: Self-pay | Source: Ambulatory Visit | Attending: Obstetrics and Gynecology | Admitting: Obstetrics and Gynecology

## 2013-03-28 DIAGNOSIS — R030 Elevated blood-pressure reading, without diagnosis of hypertension: Secondary | ICD-10-CM | POA: Insufficient documentation

## 2013-03-28 DIAGNOSIS — O200 Threatened abortion: Secondary | ICD-10-CM | POA: Insufficient documentation

## 2013-03-28 DIAGNOSIS — O039 Complete or unspecified spontaneous abortion without complication: Secondary | ICD-10-CM

## 2013-03-28 LAB — HCG, QUANTITATIVE, PREGNANCY: hCG, Beta Chain, Quant, S: 3772 m[IU]/mL — ABNORMAL HIGH (ref <5)

## 2013-03-28 LAB — CBC
Hemoglobin: 11.9 g/dL — ABNORMAL LOW (ref 12.0–15.0)
RBC: 3.81 MIL/uL — ABNORMAL LOW (ref 3.87–5.11)

## 2013-03-28 MED ORDER — KETOROLAC TROMETHAMINE 60 MG/2ML IM SOLN
60.0000 mg | Freq: Once | INTRAMUSCULAR | Status: AC
  Administered 2013-03-28: 60 mg via INTRAMUSCULAR
  Filled 2013-03-28: qty 2

## 2013-03-28 NOTE — MAU Note (Signed)
Pt here for repeat BHCG, is experiencing expected miscarriage. Has passed clots today, and was having intense abdominal cramping that was constant. Rates pain 10/10. Was given rx for percocet that was helping however has been ineffective today. Last took 2 percocet at 1500.

## 2013-03-28 NOTE — MAU Provider Note (Signed)
History     CSN: [id number]  Arrival date and time: 03/28/13 1754   None     Chief Complaint  Patient presents with  . Follow-up BHCG    HPI Janet Garrison is 37 y.o. [id number] Unknown weeks presenting for repeat BHCG>  LMP 01/01/13.  Was seen 6/16 with vaginal bleeding with clots and cramping.  BHCG 7777, Blood type B+ and ultrasound that showed an irregular gestational sac, no FP.  She is having some bleeding and cramping.  She is asking for something for pain.  Had percocet at home but did not relieve pain. Patient denies having hypertension.  Has appt with [facility] for July 31.  Past Medical History  Diagnosis Date  . Medical history non-contributory     Past Surgical History  Procedure Laterality Date  . No past surgeries      Family History  Problem Relation Age of Onset  . Hypertension Mother     History  Substance Use Topics  . Smoking status: Passive Smoke Exposure - Never Smoker  . Smokeless tobacco: Not on file  . Alcohol Use: Yes     Comment: occasional    Allergies: No Known Allergies  Prescriptions prior to admission  Medication Sig Dispense Refill  . naproxen sodium (ALEVE) 220 MG tablet Take 660 mg by mouth daily as needed (For headache.).      Marland Kitchen oxyCODONE-acetaminophen (ROXICET) 5-325 MG per tablet Take 1 tablet by mouth every 4 (four) hours as needed for pain.  15 tablet  0    Review of Systems  Gastrointestinal: Positive for abdominal pain (cramping ).  Genitourinary:       + for heavier bleeding earlier today and less now   Physical Exam   Blood pressure 143/95, pulse 80, temperature 98 F (36.7 C), temperature source Oral, resp. rate 18, height 4\' 11"  (1.499 m), weight 189 lb (85.73 kg), last menstrual period 01/01/2013.  Physical Exam  Constitutional: She is oriented to person, place, and time. She appears well-developed and well-nourished. No distress.  Cardiovascular: Normal rate.   Genitourinary:  Not indictated   Neurological: She is alert and oriented to person, place, and time.  Skin: Skin is warm and dry.  Psychiatric: She has a normal mood and affect. Her behavior is normal.   Results for orders placed during the hospital encounter of 03/28/13 (from the past 24 hour(s))  HCG, QUANTITATIVE, PREGNANCY     Status: Abnormal   Collection Time    03/28/13  6:25 PM      Result Value Range   hCG, Beta Chain, Quant, S 3772 (*) <5 mIU/mL  CBC     Status: Abnormal   Collection Time    03/28/13  6:25 PM      Result Value Range   WBC 6.1  4.0 - 10.5 K/uL   RBC 3.81 (*) 3.87 - 5.11 MIL/uL   Hemoglobin 11.9 (*) 12.0 - 15.0 g/dL   HCT 01.0 (*) 27.2 - 53.6 %   MCV 91.1  78.0 - 100.0 fL   MCH 31.2  26.0 - 34.0 pg   MCHC 34.3  30.0 - 36.0 g/dL   RDW 64.4  03.4 - 74.2 %   Platelets 313  150 - 400 K/uL   MAU Course  Procedures  MDM Toradol 60mg  IM given for cramping  Assessment and Plan  A:  Inevitable miscarriage    Borderline blood pressure      P: Follow up in the GYN  CLINIC in 2 weeks.  Will request that BP be rechecked at that time.    Patient has appt with [facility] for July 31.       Instructed not to take additional ibuprofen until tomorrow.  Janet Garrison,EVE M 03/28/2013, 7:01 PM

## 2013-03-30 NOTE — MAU Provider Note (Signed)
Attestation of Attending Supervision of Advanced Practitioner: Evaluation and management procedures were performed by the PA/NP/CNM/OB Fellow under my supervision/collaboration. Chart reviewed and agree with management and plan.  Creighton Longley V 03/30/2013 8:47 AM

## 2013-04-09 ENCOUNTER — Other Ambulatory Visit: Payer: Self-pay

## 2013-04-09 DIAGNOSIS — O039 Complete or unspecified spontaneous abortion without complication: Secondary | ICD-10-CM

## 2013-04-10 LAB — HCG, QUANTITATIVE, PREGNANCY: hCG, Beta Chain, Quant, S: 28.9 m[IU]/mL

## 2013-04-18 ENCOUNTER — Telehealth: Payer: Self-pay | Admitting: General Practice

## 2013-04-18 NOTE — Telephone Encounter (Signed)
Called patient and gave result of HCG level result from 04/09/13. Advised that we need to make sure it is less than 2. She made lab appt for tomorrow 04/19/13 @ 0800 for repeat HCG LEVEL. Patient notified and satisfied.

## 2013-04-18 NOTE — Telephone Encounter (Signed)
Patient called and left message stating she had lab work done last Monday and no one has called her with results yet

## 2013-04-18 NOTE — Telephone Encounter (Signed)
Called patient, no answer- left message on VM stating we are returning her phone call from yesterday and to call us back and let us know if it is okay to leave results on her VM or not.

## 2013-04-19 ENCOUNTER — Other Ambulatory Visit: Payer: Self-pay

## 2013-04-19 DIAGNOSIS — O039 Complete or unspecified spontaneous abortion without complication: Secondary | ICD-10-CM

## 2013-04-19 LAB — HCG, QUANTITATIVE, PREGNANCY: hCG, Beta Chain, Quant, S: <2 m[IU]/mL

## 2013-04-24 ENCOUNTER — Encounter: Payer: Self-pay | Admitting: Obstetrics and Gynecology

## 2013-04-24 ENCOUNTER — Telehealth: Payer: Self-pay | Admitting: *Deleted

## 2013-04-24 NOTE — Telephone Encounter (Signed)
Pt called requesting results of labs. I called her back and left message to call us back and let us know if okay to leave result on vm.

## 2013-05-01 NOTE — Telephone Encounter (Signed)
Called pt and pt informed me that she had already spoken to someone and has an appt scheduled for 05/16/13 @ 1245.  Pt did not have any other questions.

## 2013-05-16 ENCOUNTER — Ambulatory Visit (INDEPENDENT_AMBULATORY_CARE_PROVIDER_SITE_OTHER): Payer: Self-pay | Admitting: Obstetrics and Gynecology

## 2013-05-16 ENCOUNTER — Encounter: Payer: Self-pay | Admitting: Obstetrics and Gynecology

## 2013-05-16 VITALS — BP 128/89 | HR 77 | Temp 98.4°F | Resp 20 | Ht 59.0 in | Wt 184.7 lb

## 2013-05-16 DIAGNOSIS — O039 Complete or unspecified spontaneous abortion without complication: Secondary | ICD-10-CM

## 2013-05-16 DIAGNOSIS — Z3049 Encounter for surveillance of other contraceptives: Secondary | ICD-10-CM

## 2013-05-16 LAB — POCT PREGNANCY, URINE: Preg Test, Ur: NEGATIVE

## 2013-05-16 MED ORDER — MEDROXYPROGESTERONE ACETATE 150 MG/ML IM SUSP
150.0000 mg | INTRAMUSCULAR | Status: AC
  Administered 2013-05-16: 150 mg via INTRAMUSCULAR

## 2013-05-16 NOTE — Progress Notes (Signed)
  Subjective:    Patient ID: Janet Garrison, female    DOB: June 23, 1976, 37 y.o.   MRN: [id number]  HPI 37 yo [id number] s/p spontaneous miscarriage on 03/28/2013 presenting today as an MAU follow up. Patient reports doing well without complaints. LMP 05/12/2013. This was an unplanned pregnancy and patient desires to be restarted on Depo-Provera.  Past Medical History  Diagnosis Date  . Medical history non-contributory    Past Surgical History  Procedure Laterality Date  . No past surgeries     Family History  Problem Relation Age of Onset  . Hypertension Mother    History  Substance Use Topics  . Smoking status: Passive Smoke Exposure - Never Smoker  . Smokeless tobacco: Not on file  . Alcohol Use: Yes     Comment: occasional      Review of Systems  All other systems reviewed and are negative.       Objective:   Physical Exam GENERAL: Well-developed, well-nourished female in no acute distress.  ABDOMEN: Soft, nontender, nondistended. No organomegaly. PELVIC: Normal external female genitalia. Vagina is pink and rugated.  Normal discharge. Normal appearing cervix. Uterus is normal in size. No adnexal mass or tenderness. EXTREMITIES: No cyanosis, clubbing, or edema, 2+ distal pulses.     Assessment & Plan:  37 yo s/p spontaneous miscarriage - Patient medically cleared to resume all activities of daily living - Birth control options discussed- patient wishes to be restarted on Depo-Provera which will be given today. Patient advised to use back up birth control for the next 2-3 weeks - RTC prn for annual exam -RTC in 12 weeks for repeat Depo-Provera

## 2013-05-16 NOTE — Progress Notes (Signed)
Pt desires to start Depo Provera injections for birth control. She was taking it previously for >1 yr- last had Depo in August 2013- had no problems.   UPT = Neg

## 2013-08-16 ENCOUNTER — Other Ambulatory Visit: Payer: Self-pay

## 2013-08-17 ENCOUNTER — Ambulatory Visit: Payer: Self-pay

## 2013-08-22 ENCOUNTER — Ambulatory Visit: Payer: Self-pay

## 2014-03-08 ENCOUNTER — Emergency Department (HOSPITAL_COMMUNITY): Payer: No Typology Code available for payment source

## 2014-03-08 ENCOUNTER — Emergency Department (HOSPITAL_COMMUNITY)
Admission: EM | Admit: 2014-03-08 | Discharge: 2014-03-08 | Disposition: A | Discharge status: Home / Self Care | Payer: No Typology Code available for payment source | Attending: Emergency Medicine | Admitting: Emergency Medicine

## 2014-03-08 DIAGNOSIS — S139XXA Sprain of joints and ligaments of unspecified parts of neck, initial encounter: Secondary | ICD-10-CM | POA: Insufficient documentation

## 2014-03-08 DIAGNOSIS — S4980XA Other specified injuries of shoulder and upper arm, unspecified arm, initial encounter: Secondary | ICD-10-CM | POA: Insufficient documentation

## 2014-03-08 DIAGNOSIS — S46909A Unspecified injury of unspecified muscle, fascia and tendon at shoulder and upper arm level, unspecified arm, initial encounter: Secondary | ICD-10-CM | POA: Insufficient documentation

## 2014-03-08 DIAGNOSIS — M79644 Pain in right finger(s): Secondary | ICD-10-CM

## 2014-03-08 DIAGNOSIS — IMO0002 Reserved for concepts with insufficient information to code with codable children: Secondary | ICD-10-CM | POA: Insufficient documentation

## 2014-03-08 DIAGNOSIS — S6990XA Unspecified injury of unspecified wrist, hand and finger(s), initial encounter: Secondary | ICD-10-CM | POA: Insufficient documentation

## 2014-03-08 DIAGNOSIS — Z79899 Other long term (current) drug therapy: Secondary | ICD-10-CM | POA: Insufficient documentation

## 2014-03-08 DIAGNOSIS — S81809A Unspecified open wound, unspecified lower leg, initial encounter: Secondary | ICD-10-CM

## 2014-03-08 DIAGNOSIS — S81009A Unspecified open wound, unspecified knee, initial encounter: Secondary | ICD-10-CM | POA: Insufficient documentation

## 2014-03-08 DIAGNOSIS — S6980XA Other specified injuries of unspecified wrist, hand and finger(s), initial encounter: Secondary | ICD-10-CM | POA: Insufficient documentation

## 2014-03-08 DIAGNOSIS — S91009A Unspecified open wound, unspecified ankle, initial encounter: Secondary | ICD-10-CM

## 2014-03-08 DIAGNOSIS — Y92410 Unspecified street and highway as the place of occurrence of the external cause: Secondary | ICD-10-CM | POA: Insufficient documentation

## 2014-03-08 DIAGNOSIS — R51 Headache: Secondary | ICD-10-CM | POA: Insufficient documentation

## 2014-03-08 DIAGNOSIS — Z791 Long term (current) use of non-steroidal anti-inflammatories (NSAID): Secondary | ICD-10-CM | POA: Insufficient documentation

## 2014-03-08 DIAGNOSIS — Y9389 Activity, other specified: Secondary | ICD-10-CM | POA: Insufficient documentation

## 2014-03-08 DIAGNOSIS — S161XXA Strain of muscle, fascia and tendon at neck level, initial encounter: Secondary | ICD-10-CM

## 2014-03-08 MED ORDER — OXYCODONE-ACETAMINOPHEN 5-325 MG PO TABS
1.0000 | ORAL_TABLET | Freq: Once | ORAL | Status: AC
  Administered 2014-03-08: 1 via ORAL
  Filled 2014-03-08: qty 1

## 2014-03-08 MED ORDER — HYDROCODONE-ACETAMINOPHEN 5-325 MG PO TABS
1.0000 | ORAL_TABLET | Freq: Once | ORAL | Status: AC
  Administered 2014-03-08: 1 via ORAL
  Filled 2014-03-08: qty 1

## 2014-03-08 MED ORDER — IBUPROFEN 200 MG PO TABS
400.0000 mg | ORAL_TABLET | Freq: Once | ORAL | Status: AC
  Administered 2014-03-08: 400 mg via ORAL
  Filled 2014-03-08: qty 2

## 2014-03-08 MED ORDER — CYCLOBENZAPRINE HCL 10 MG PO TABS: 10.0000 mg | ORAL_TABLET | Freq: Two times a day (BID) | ORAL | Status: DC | PRN

## 2014-03-08 MED ORDER — HYDROCODONE-ACETAMINOPHEN 5-325 MG PO TABS: 1.0000 | ORAL_TABLET | Freq: Four times a day (QID) | ORAL | Status: DC | PRN

## 2014-03-08 MED ORDER — NAPROXEN 500 MG PO TABS: 500.0000 mg | ORAL_TABLET | Freq: Two times a day (BID) | ORAL | Status: DC

## 2014-03-08 NOTE — Progress Notes (Signed)
Orthopedic Tech Progress Note Patient Details:  Janet Garrison 1976-04-18 [id number]  Ortho Devices Type of Ortho Device: Soft collar;Thumb velcro splint Ortho Device/Splint Interventions: Application   Pasty Arch 03/08/2014, 10:44 PM
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
## 2014-03-08 NOTE — Discharge Instructions (Signed)
Cervical Sprain A cervical sprain is when the tissues (ligaments) that hold the neck bones in place stretch or tear. HOME CARE   Put ice on the injured area.  Put ice in a plastic bag.  Place a towel between your skin and the bag.  Leave the ice on for 15 20 minutes, 3 4 times a day.  You may have been given a collar to wear. This collar keeps your neck from moving while you heal.  Do not take the collar off unless told by your doctor.  If you have long hair, keep it outside of the collar.  Ask your doctor before changing the position of your collar. You may need to change its position over time to make it more comfortable.  If you are allowed to take off the collar for cleaning or bathing, follow your doctor's instructions on how to do it safely.  Keep your collar clean by wiping it with mild soap and water. Dry it completely. If the collar has removable pads, remove them every 1 2 days to hand wash them with soap and water. Allow them to air dry. They should be dry before you wear them in the collar.  Do not drive while wearing the collar.  Only take medicine as told by your doctor.  Keep all doctor visits as told.  Keep all physical therapy visits as told.  Adjust your work station so that you have good posture while you work.  Avoid positions and activities that make your problems worse.  Warm up and stretch before being active. GET HELP IF:  Your pain is not controlled with medicine.  You cannot take less pain medicine over time as planned.  Your activity level does not improve as expected. GET HELP RIGHT AWAY IF:   You are bleeding.  Your stomach is upset.  You have an allergic reaction to your medicine.  You develop new problems that you cannot explain.  You lose feeling (become numb) or you cannot move any part of your body (paralysis).  You have tingling or weakness in any part of your body.  Your symptoms get worse. Symptoms include:  Pain,  soreness, stiffness, puffiness (swelling), or a burning feeling in your neck.  Pain when your neck is touched.  Shoulder or upper back pain.  Limited ability to move your neck.  Headache.  Dizziness.  Your hands or arms feel week, lose feeling, or tingle.  Muscle spasms.  Difficulty swallowing or chewing. MAKE SURE YOU:   Understand these instructions.  Will watch your condition.  Will get help right away if you are not doing well or get worse. Document Released: 03/15/2008 Document Revised: 05/30/2013 Document Reviewed: 04/04/2013 [facility] Patient Information 2014 [place], [place]. Thumb Sprain Your exam shows you have a sprained thumb. This means the ligaments around the joint have been torn. Thumb sprains usually take 3-6 weeks to heal. However, severe, unstable sprains may need to be fixed surgically. Sometimes a small piece of bone is pulled off by the ligament. If this is not treated properly, a sprained thumb can lead to a painful, weak joint. Treatment helps reduce pain and shortens the period of disability. The thumb, and often the wrist, must remain splinted for the first 2-4 weeks to protect the joint. Keep your hand elevated and apply ice packs frequently to the injured area (20-30 minutes every 2-3 hours) for the next 2-4 days. This helps reduce swelling and control pain. Pain medicine may also be used for  several days. Motion and strengthening exercises may later be prescribed for the joint to return to normal function. Be sure to see your doctor for follow-up because your thumb joint may require further support with splints, bandages or tape. Please see your doctor or go to the emergency room right away if you have increased pain despite proper treatment, or a numb, cold, or pale thumb. Document Released: 11/04/2004 Document Revised: 12/20/2011 Document Reviewed: 09/28/2008 [facility] Patient Information 2014 [place], [place]. Motor Vehicle Collision  It is common to have  multiple bruises and sore muscles after a motor vehicle collision (MVC). These tend to feel worse for the first 24 hours. You may have the most stiffness and soreness over the first several hours. You may also feel worse when you wake up the first morning after your collision. After this point, you will usually begin to improve with each day. The speed of improvement often depends on the severity of the collision, the number of injuries, and the location and nature of these injuries. HOME CARE INSTRUCTIONS   Put ice on the injured area.  Put ice in a plastic bag.  Place a towel between your skin and the bag.  Leave the ice on for 15-20 minutes, 03-04 times a day.  Drink enough fluids to keep your urine clear or pale yellow. Do not drink alcohol.  Take a warm shower or bath once or twice a day. This will increase blood flow to sore muscles.  You may return to activities as directed by your caregiver. Be careful when lifting, as this may aggravate neck or back pain.  Only take over-the-counter or prescription medicines for pain, discomfort, or fever as directed by your caregiver. Do not use aspirin. This may increase bruising and bleeding. SEEK IMMEDIATE MEDICAL CARE IF:  You have numbness, tingling, or weakness in the arms or legs.  You develop severe headaches not relieved with medicine.  You have severe neck pain, especially tenderness in the middle of the back of your neck.  You have changes in bowel or bladder control.  There is increasing pain in any area of the body.  You have shortness of breath, lightheadedness, dizziness, or fainting.  You have chest pain.  You feel sick to your stomach (nauseous), throw up (vomit), or sweat.  You have increasing abdominal discomfort.  There is blood in your urine, stool, or vomit.  You have pain in your shoulder (shoulder strap areas).  You feel your symptoms are getting worse. MAKE SURE YOU:   Understand these  instructions.  Will watch your condition.  Will get help right away if you are not doing well or get worse. Document Released: 09/27/2005 Document Revised: 12/20/2011 Document Reviewed: 02/24/2011 [facility] Patient Information 2014 [place], [place].
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
## 2014-03-09 NOTE — ED Provider Notes (Signed)
Medical screening examination/treatment/procedure(s) were performed by non-physician practitioner and as supervising physician I was immediately available for consultation/collaboration.   EKG Interpretation None        Vernee Baines, MD 03/09/14 1115 

## 2014-08-12 ENCOUNTER — Encounter: Payer: Self-pay | Admitting: Obstetrics and Gynecology
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
## 2015-10-10 ENCOUNTER — Other Ambulatory Visit: Payer: Self-pay | Admitting: Nurse Practitioner

## 2015-10-10 ENCOUNTER — Ambulatory Visit
Admission: RE | Admit: 2015-10-10 | Discharge: 2015-10-10 | Disposition: A | Discharge status: Home / Self Care | Payer: 59 | Source: Ambulatory Visit | Attending: Internal Medicine | Admitting: Internal Medicine

## 2015-10-10 DIAGNOSIS — M25572 Pain in left ankle and joints of left foot: Secondary | ICD-10-CM

## 2017-02-12 ENCOUNTER — Encounter (HOSPITAL_COMMUNITY): Payer: Self-pay | Admitting: Emergency Medicine

## 2017-02-12 ENCOUNTER — Ambulatory Visit (HOSPITAL_COMMUNITY)
Admission: EM | Admit: 2017-02-12 | Discharge: 2017-02-12 | Disposition: A | Discharge status: Home / Self Care | Payer: Self-pay | Attending: Internal Medicine | Admitting: Internal Medicine

## 2017-02-12 DIAGNOSIS — N3001 Acute cystitis with hematuria: Secondary | ICD-10-CM | POA: Insufficient documentation

## 2017-02-12 LAB — POCT URINALYSIS DIP (DEVICE)
Bilirubin Urine: NEGATIVE
Glucose, UA: NEGATIVE mg/dL
Ketones, ur: NEGATIVE mg/dL
NITRITE: POSITIVE — AB
PROTEIN: NEGATIVE mg/dL
SPECIFIC GRAVITY, URINE: 1.02 (ref 1.005–1.030)
UROBILINOGEN UA: 1 mg/dL (ref 0.0–1.0)
pH: 7 (ref 5.0–8.0)

## 2017-02-12 MED ORDER — NITROFURANTOIN MONOHYD MACRO 100 MG PO CAPS: 100.0000 mg | ORAL_CAPSULE | Freq: Two times a day (BID) | ORAL | 0 refills | Status: AC

## 2017-02-12 NOTE — ED Provider Notes (Signed)
CSN: [id number]     Arrival date & time 02/12/17  1159 History   First MD Initiated Contact with Patient 02/12/17 1242     Chief Complaint  Patient presents with  . 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 She works part-time at [employer] occasionally do heavy lifting as well. Patient denies istory of similar back pain in the past.      Back Pain  Location:  Lumbar spine Quality:  Shooting Radiates to:  Does not radiate Pain severity:  Severe Pain is:  Same all the time Duration:  6 days Timing:  Constant Progression:  Worsening Chronicity:  New Relieved by:  Nothing Worsened by:  Nothing Ineffective treatments:  None tried Associated symptoms: no abdominal pain, no dysuria, no fever, no headaches, no leg pain, no numbness, no tingling and no weakness     Past Medical History:  Diagnosis Date  . Medical history non-contributory    Past Surgical History:  Procedure Laterality Date  . NO PAST SURGERIES     Family History  Problem Relation Age of Onset  . Hypertension Mother   . Cancer Father     bladder   Social History  Substance Use Topics  . Smoking status: Passive Smoke Exposure - Never Smoker  . Smokeless tobacco: Never Used  . Alcohol use Yes     Comment: occasional   OB History    Gravida Para Term Preterm AB Living   3 1   1 1 1    SAB TAB Ectopic Multiple Live Births     1           Review of Systems  Constitutional: Negative for fever.  Respiratory: Negative.   Cardiovascular: Negative.   Gastrointestinal: Negative for abdominal pain.  Genitourinary: Negative for dysuria.  Musculoskeletal: Positive for back pain.  Neurological: Negative for tingling, weakness, numbness and headaches.    Allergies  Patient has no known allergies.  Home Medications   Prior to Admission medications   Medication Sig  Start Date End Date Taking? Authorizing Provider  cloNIDine (CATAPRES) 0.1 MG tablet Take 0.1 mg by mouth 2 (two) times daily.    [provider]  cyclobenzaprine (FLEXERIL) 10 MG tablet Take 1 tablet (10 mg total) by mouth 2 (two) times daily as needed for muscle spasms. 03/08/14   Felicie Morn, NP  HYDROcodone-acetaminophen (NORCO/VICODIN) 5-325 MG per tablet Take 1 tablet by mouth every 6 (six) hours as needed for severe pain. 03/08/14   Felicie Morn, NP  naproxen (NAPROSYN) 500 MG tablet Take 1 tablet (500 mg total) by mouth 2 (two) times daily. 03/08/14   Felicie Morn, NP  naproxen sodium (ALEVE) 220 MG tablet Take 660 mg by mouth daily as needed (For headache.).    [provider]  nitrofurantoin, macrocrystal-monohydrate, (MACROBID) 100 MG capsule Take 1 capsule (100 mg total) by mouth 2 (two) times daily. 02/12/17 02/17/17  Lucia Estelle, NP   Meds Ordered and Administered this Visit  Medications - No data to display  BP (!) 148/104 (BP Location: Left Arm)   Pulse 88   Temp 98.3 F (36.8 C) (Oral)   Resp 16   LMP 01/30/2017   SpO2 100%   Breastfeeding? No  No data found.   Physical Exam  Constitutional: She is oriented to person, place, and time. She appears well-developed and well-nourished.  HENT:  Head: Normocephalic and atraumatic.  Neck: Normal range of  motion.  Cardiovascular: Normal rate, regular rhythm and normal heart sounds.   No murmur heard. Pulmonary/Chest: Effort normal and breath sounds normal. She has no wheezes.  Abdominal: Soft. Bowel sounds are normal. There is no tenderness.  Genitourinary:  Genitourinary Comments: Has positive right CVA tenderness  Musculoskeletal:  Has full spine range of motion. Lumbar spine nontender to palpate. Gait appropriate. Negative straight leg raise  Neurological: She is alert and oriented to person, place, and time.  Skin: Skin is warm and dry.  Nursing note and vitals reviewed.   Urgent Care Course      Procedures (including critical care time)  Labs Review Labs Reviewed  POCT URINALYSIS DIP (DEVICE) - Abnormal; Notable for the following:       Result Value   Hgb urine dipstick SMALL (*)    Nitrite POSITIVE (*)    Leukocytes, UA SMALL (*)    All other components within normal limits  URINE CULTURE    Imaging Review No results found.  12;51: Will obtain urine specimen to assess for UTI.  MDM   1. Acute cystitis with hematuria    UA has postiive Nitrite, Leukocytes, and small Hgb. This back pain is most likely to be due to UTI. Will treat with macrobid BID x 5 days. Urine culture ordered. Informed to f/u with PCP for no improvement. Discharge paperwork given. All questions answered.    Lucia EstelleZheng, Catalina Salasar, NP 02/12/17 1316

## 2017-02-12 NOTE — ED Triage Notes (Signed)
Here for lower back pain w/activity  Does not recall any inj/trauma or strenuous activity  Denies weakness/numbness of all extremities  Steady gait... A&O x4... NAD

## 2017-02-15 LAB — URINE CULTURE: Culture: >=100000 — AB

## 2017-08-04 ENCOUNTER — Ambulatory Visit (INDEPENDENT_AMBULATORY_CARE_PROVIDER_SITE_OTHER): Payer: 59 | Admitting: Podiatry

## 2017-08-04 ENCOUNTER — Ambulatory Visit (INDEPENDENT_AMBULATORY_CARE_PROVIDER_SITE_OTHER): Payer: 59

## 2017-08-04 VITALS — BP 148/97 | HR 87 | Ht 59.0 in | Wt 188.0 lb

## 2017-08-04 DIAGNOSIS — M7662 Achilles tendinitis, left leg: Secondary | ICD-10-CM

## 2017-08-04 DIAGNOSIS — M659 Synovitis and tenosynovitis, unspecified: Secondary | ICD-10-CM | POA: Diagnosis not present

## 2017-08-04 DIAGNOSIS — M25572 Pain in left ankle and joints of left foot: Secondary | ICD-10-CM

## 2017-08-04 MED ORDER — MELOXICAM 15 MG PO TABS: 15.0000 mg | ORAL_TABLET | Freq: Every day | ORAL | 0 refills | Status: DC

## 2017-08-04 NOTE — Progress Notes (Addendum)
   Subjective:    Patient ID: Janet Garrison, female    DOB: 05/30/1976, 41 y.o.   MRN: [id number]  HPI  Chief Complaint  Patient presents with  . Foot Pain    Left ankle/foot pain. Xrays in 2015  . Difficulty Walking    Bilateral burning bottom of feet. Works 2 jobs and is on her feet for both   41 y.o. female presents with the above complaint.  Reports left ankle pain in the back of her ankle.  Has been hurting for quite some time.  Also reports burning to the bottom of both feet.  States that she has 2 jobs and is on her feet for both which cause significant pain.  Past Medical History:  Diagnosis Date  . Medical history non-contributory    Past Surgical History:  Procedure Laterality Date  . NO PAST SURGERIES      Current Outpatient Medications:  .  cloNIDine (CATAPRES) 0.1 MG tablet, Take 0.1 mg by mouth 2 (two) times daily., Disp: , Rfl:  .  cyclobenzaprine (FLEXERIL) 10 MG tablet, Take 1 tablet (10 mg total) by mouth 2 (two) times daily as needed for muscle spasms. (Patient not taking: Reported on 08/04/2017), Disp: 20 tablet, Rfl: 0 .  HYDROcodone-acetaminophen (NORCO/VICODIN) 5-325 MG per tablet, Take 1 tablet by mouth every 6 (six) hours as needed for severe pain. (Patient not taking: Reported on 08/04/2017), Disp: 10 tablet, Rfl: 0 .  meloxicam (MOBIC) 15 MG tablet, Take 1 tablet (15 mg total) by mouth daily., Disp: 30 tablet, Rfl: 0 .  naproxen (NAPROSYN) 500 MG tablet, Take 1 tablet (500 mg total) by mouth 2 (two) times daily., Disp: 20 tablet, Rfl: 0 .  naproxen sodium (ALEVE) 220 MG tablet, Take 660 mg by mouth daily as needed (For headache.)., Disp: , Rfl:   No Known Allergies     Review of Systems  Musculoskeletal: Positive for arthralgias, joint swelling and myalgias.  All other systems reviewed and are negative.      Objective:   Physical Exam Vitals:   08/04/17 1436  BP: (!) 148/97  Pulse: 87   Vitals:   08/04/17 1436  BP: (!) 148/97  Pulse:  87   General AA&O x3. Normal mood and affect.  Vascular Dorsalis pedis and posterior tibial pulses  present 2+ bilaterally  Capillary refill normal to all digits. Pedal hair growth normal.  Neurologic Epicritic sensation grossly present bilaterally.  Dermatologic No open lesions. Interspaces clear of maceration. Nails well groomed and normal in appearance.  Orthopedic: MMT 5/5 in dorsiflexion, plantarflexion, inversion, and eversion bilat. Tender to palpation at the posterior calcaneus left. No pain with calcaneal squeeze left. Ankle ROM diminished range with pain left. Silfverskiold Test: positive bilaterally.   Radiographs: Taken and reviewed. No acute fractures. No evidence of calcaneal stress fracture. Posterior calcaneal spurring noted.    Assessment & Plan:  Evaluate and treated.  All questions answered.  Achilles Tendonitis, left -XR reviewed as above. -Educated on stretching and icing of the affected limb. -eRx for Meloxicam  -Trilock ankle brace dispensed.

## 2017-08-04 NOTE — Patient Instructions (Signed)
Achilles Tendinitis  with Rehab Achilles tendinitis is a disorder of the Achilles tendon. The Achilles tendon connects the large calf muscles (Gastrocnemius and Soleus) to the heel bone (calcaneus). This tendon is sometimes called the heel cord. It is important for pushing-off and standing on your toes and is important for walking, running, or jumping. Tendinitis is often caused by overuse and repetitive microtrauma. SYMPTOMS  Pain, tenderness, swelling, warmth, and redness may occur over the Achilles tendon even at rest.  Pain with pushing off, or flexing or extending the ankle.  Pain that is worsened after or during activity. CAUSES   Overuse sometimes seen with rapid increase in exercise programs or in sports requiring running and jumping.  Poor physical conditioning (strength and flexibility or endurance).  Running sports, especially training running down hills.  Inadequate warm-up before practice or play or failure to stretch before participation.  Injury to the tendon. PREVENTION   Warm up and stretch before practice or competition.  Allow time for adequate rest and recovery between practices and competition.  Keep up conditioning.  Keep up ankle and leg flexibility.  Improve or keep muscle strength and endurance.  Improve cardiovascular fitness.  Use proper technique.  Use proper equipment (shoes, skates).  To help prevent recurrence, taping, protective strapping, or an adhesive bandage may be recommended for several weeks after healing is complete. PROGNOSIS   Recovery may take weeks to several months to heal.  Longer recovery is expected if symptoms have been prolonged.  Recovery is usually quicker if the inflammation is due to a direct blow as compared with overuse or sudden strain. RELATED COMPLICATIONS   Healing time will be prolonged if the condition is not correctly treated. The injury must be given plenty of time to heal.  Symptoms can reoccur if  activity is resumed too soon.  Untreated, tendinitis may increase the risk of tendon rupture requiring additional time for recovery and possibly surgery. TREATMENT   The first treatment consists of rest anti-inflammatory medication, and ice to relieve the pain.  Stretching and strengthening exercises after resolution of pain will likely help reduce the risk of recurrence. Referral to a physical therapist or athletic trainer for further evaluation and treatment may be helpful.  A walking boot or cast may be recommended to rest the Achilles tendon. This can help break the cycle of inflammation and microtrauma.  Arch supports (orthotics) may be prescribed or recommended by your caregiver as an adjunct to therapy and rest.  Surgery to remove the inflamed tendon lining or degenerated tendon tissue is rarely necessary and has shown less than predictable results. MEDICATION   Nonsteroidal anti-inflammatory medications, such as aspirin and ibuprofen, may be used for pain and inflammation relief. Do not take within 7 days before surgery. Take these as directed by your caregiver. Contact your caregiver immediately if any bleeding, stomach upset, or signs of allergic reaction occur. Other minor pain relievers, such as acetaminophen, may also be used.  Pain relievers may be prescribed as necessary by your caregiver. Do not take prescription pain medication for longer than 4 to 7 days. Use only as directed and only as much as you need.  Cortisone injections are rarely indicated. Cortisone injections may weaken tendons and predispose to rupture. It is better to give the condition more time to heal than to use them. HEAT AND COLD  Cold is used to relieve pain and reduce inflammation for acute and chronic Achilles tendinitis. Cold should be applied for 10 to 15 minutes   every 2 to 3 hours for inflammation and pain and immediately after any activity that aggravates your symptoms. Use ice packs or an ice  massage.  Heat may be used before performing stretching and strengthening activities prescribed by your caregiver. Use a heat pack or a warm soak. SEEK MEDICAL CARE IF:  Symptoms get worse or do not improve in 2 weeks despite treatment.  New, unexplained symptoms develop. Drugs used in treatment may produce side effects.  EXERCISES:  RANGE OF MOTION (ROM) AND STRETCHING EXERCISES - Achilles Tendinitis  These exercises may help you when beginning to rehabilitate your injury. Your symptoms may resolve with or without further involvement from your physician, physical therapist or athletic trainer. While completing these exercises, remember:   Restoring tissue flexibility helps normal motion to return to the joints. This allows healthier, less painful movement and activity.  An effective stretch should be held for at least 30 seconds.  A stretch should never be painful. You should only feel a gentle lengthening or release in the stretched tissue.  STRETCH  Gastroc, Standing   Place hands on wall.  Extend right / left leg, keeping the front knee somewhat bent.  Slightly point your toes inward on your back foot.  Keeping your right / left heel on the floor and your knee straight, shift your weight toward the wall, not allowing your back to arch.  You should feel a gentle stretch in the right / left calf. Hold this position for 10 seconds. Repeat 3 times. Complete this stretch 2 times per day.  STRETCH  Soleus, Standing   Place hands on wall.  Extend right / left leg, keeping the other knee somewhat bent.  Slightly point your toes inward on your back foot.  Keep your right / left heel on the floor, bend your back knee, and slightly shift your weight over the back leg so that you feel a gentle stretch deep in your back calf.  Hold this position for 10 seconds. Repeat 3 times. Complete this stretch 2 times per day.  STRETCH  Gastrocsoleus, Standing  Note: This exercise can place  a lot of stress on your foot and ankle. Please complete this exercise only if specifically instructed by your caregiver.   Place the ball of your right / left foot on a step, keeping your other foot firmly on the same step.  Hold on to the wall or a rail for balance.  Slowly lift your other foot, allowing your body weight to press your heel down over the edge of the step.  You should feel a stretch in your right / left calf.  Hold this position for 10 seconds.  Repeat this exercise with a slight bend in your knee. Repeat 3 times. Complete this stretch 2 times per day.   STRENGTHENING EXERCISES - Achilles Tendinitis These exercises may help you when beginning to rehabilitate your injury. They may resolve your symptoms with or without further involvement from your physician, physical therapist or athletic trainer. While completing these exercises, remember:   Muscles can gain both the endurance and the strength needed for everyday activities through controlled exercises.  Complete these exercises as instructed by your physician, physical therapist or athletic trainer. Progress the resistance and repetitions only as guided.  You may experience muscle soreness or fatigue, but the pain or discomfort you are trying to eliminate should never worsen during these exercises. If this pain does worsen, stop and make certain you are following the directions exactly. If   the pain is still present after adjustments, discontinue the exercise until you can discuss the trouble with your clinician.  STRENGTH - Plantar-flexors   Sit with your right / left leg extended. Holding onto both ends of a rubber exercise band/tubing, loop it around the ball of your foot. Keep a slight tension in the band.  Slowly push your toes away from you, pointing them downward.  Hold this position for 10 seconds. Return slowly, controlling the tension in the band/tubing. Repeat 3 times. Complete this exercise 2 times per day.     STRENGTH - Plantar-flexors   Stand with your feet shoulder width apart. Steady yourself with a wall or table using as little support as needed.  Keeping your weight evenly spread over the width of your feet, rise up on your toes.*  Hold this position for 10 seconds. Repeat 3 times. Complete this exercise 2 times per day.  *If this is too easy, shift your weight toward your right / left leg until you feel challenged. Ultimately, you may be asked to do this exercise with your right / left foot only.  STRENGTH  Plantar-flexors, Eccentric  Note: This exercise can place a lot of stress on your foot and ankle. Please complete this exercise only if specifically instructed by your caregiver.   Place the balls of your feet on a step. With your hands, use only enough support from a wall or rail to keep your balance.  Keep your knees straight and rise up on your toes.  Slowly shift your weight entirely to your right / left toes and pick up your opposite foot. Gently and with controlled movement, lower your weight through your right / left foot so that your heel drops below the level of the step. You will feel a slight stretch in the back of your calf at the end position.  Use the healthy leg to help rise up onto the balls of both feet, then lower weight only on the right / left leg again. Build up to 15 repetitions. Then progress to 3 consecutive sets of 15 repetitions.*  After completing the above exercise, complete the same exercise with a slight knee bend (about 30 degrees). Again, build up to 15 repetitions. Then progress to 3 consecutive sets of 15 repetitions.* Perform this exercise 2 times per day.  *When you easily complete 3 sets of 15, your physician, physical therapist or athletic trainer may advise you to add resistance by wearing a backpack filled with additional weight.  STRENGTH - Plantar Flexors, Seated   Sit on a chair that allows your feet to rest flat on the ground. If  necessary, sit at the edge of the chair.  Keeping your toes firmly on the ground, lift your right / left heel as far as you can without increasing any discomfort in your ankle. Repeat 3 times. Complete this exercise 2 times a day.  Morton Neuralgia Morton neuralgia is a type of foot pain in the area closest to your toes. This area is sometimes called the ball of your foot. Morton neuralgia occurs when a branch of a nerve in your foot (digital nerve) becomes compressed. When this happens over a long period of time, the nerve can thicken (neuroma) and cause pain. This usually occurs between the third and fourth toe. Morton neuralgia can come and go but may get worse over time. What are the causes? Your digital nerve can become compressed and stretched at a point where it passes under a thick  band of tissue that connects your toes (intermetatarsal ligament). Morton neuralgia can be caused by mild repetitive damage in this area. This type of damage can result from:  Activities such as running or jumping.  Wearing shoes that are too tight.  What increases the risk? You may be at risk for Morton neuralgia if you:  Are female.  Wear high heels.  Wear shoes that are narrow or tight.  Participate in activities that stretch your toes. These include: ? Running. ? Ballet. ? Long-distance walking.  What are the signs or symptoms? The first symptom of Morton neuralgia is pain that spreads from the ball of your foot to your toes. It may feel like you are walking on a marble. Pain usually gets worse with walking and goes away at night. Other symptoms may include numbness and cramping of your toes. How is this diagnosed? Your health care provider will do a physical exam. When doing the exam, your health care provider may:  Squeeze your foot just behind your toe.  Ask you to move your toes to check for pain.  You may also have tests on your foot to confirm the diagnosis. These may include:  An  X-ray.  An MRI.  How is this treated? Treatment for Morton neuralgia may be as simple as changing the kind of shoes you wear. Other treatments may include:  Wearing a supportive pad (orthosis) under the front of your foot. This lifts your toe bones and takes pressure off the nerve.  Getting injections of numbing medicine and anti-inflammatory medicine (steroid) in the nerve.  Having surgery to remove part of the thickened nerve.  Follow these instructions at home:  Take medicine only as directed by your health care provider.  Wear soft-soled shoes with a wide toe area.  Stop activities that may be causing pain.  Elevate your foot when resting.  Massage your foot.  Apply ice to the injured area: ? Put ice in a plastic bag. ? Place a towel between your skin and the bag. ? Leave the ice on for 20 minutes, 2-3 times a day.  Keep all follow-up visits as directed by your health care provider. This is important. Contact a health care provider if:  Home care instructions are not helping you get better.  Your symptoms change or get worse. This information is not intended to replace advice given to you by your health care provider. Make sure you discuss any questions you have with your health care provider. Document Released: 01/03/2001 Document Revised: 03/04/2016 Document Reviewed: 11/28/2013 Elsevier Interactive Patient Education  [employer].

## 2017-09-08 ENCOUNTER — Ambulatory Visit: Payer: 59 | Admitting: Podiatry

## 2017-09-09 ENCOUNTER — Ambulatory Visit: Payer: 59 | Admitting: Podiatry

## 2017-09-15 ENCOUNTER — Encounter: Payer: Self-pay | Admitting: Podiatry

## 2017-09-15 ENCOUNTER — Ambulatory Visit (INDEPENDENT_AMBULATORY_CARE_PROVIDER_SITE_OTHER): Payer: 59 | Admitting: Podiatry

## 2017-09-15 DIAGNOSIS — M659 Synovitis and tenosynovitis, unspecified: Secondary | ICD-10-CM

## 2017-09-15 DIAGNOSIS — M7662 Achilles tendinitis, left leg: Secondary | ICD-10-CM | POA: Diagnosis not present

## 2017-09-15 MED ORDER — CELECOXIB 200 MG PO CAPS: 200.0000 mg | ORAL_CAPSULE | Freq: Two times a day (BID) | ORAL | 0 refills | Status: DC

## 2017-09-15 NOTE — Progress Notes (Signed)
  Subjective:  Patient ID: Janet Garrison, female    DOB: 07/12/1976,  MRN: [id number]  Chief Complaint  Patient presents with  . Tendonitis    Left Ankle, still hurts and burns, meloxicam not helping    41 y.o. female returns for the above complaint.  States that the meloxicam is not helping still having pain.  Has been attempting to do her stretching however she states that her foot keeps hurting her as she works on her feet all day  Objective:  There were no vitals filed for this visit. General AA&O x3. Normal mood and affect.  Vascular Pedal pulses palpable.  Neurologic Epicritic sensation grossly intact.  Dermatologic No open lesions. Skin normal texture and turgor.  Orthopedic: Pain to palpation posterior aspect of the left calcaneus at the Achilles tendon insertion   Assessment & Plan:  Patient was evaluated and treated and all questions answered.  Achilles tendinitis, left -Switch to Celebrex  -DC meloxicam -Refer to physical therapy for strengthening stretching and possible iontophoresis  Return in about 4 weeks (around 10/13/2017) for Tendonitis.

## 2017-09-16 ENCOUNTER — Telehealth: Payer: Self-pay | Admitting: Podiatry

## 2017-09-16 NOTE — Telephone Encounter (Signed)
Dr. Samuella CotaPrice prescribed a medication for me yesterday to the [facility] [address]. My insurance company is wanting to know why I'm taking the medication before they will sign off on it. Please call me back at [phone]. Thank you.

## 2017-09-20 ENCOUNTER — Telehealth: Payer: Self-pay | Admitting: *Deleted

## 2017-09-20 NOTE — Telephone Encounter (Signed)
Pt request pain medication that was ordered Thursday. Left message informing pt the medication that was ordered required prior authorization and we were waiting for it to process.

## 2017-10-13 ENCOUNTER — Ambulatory Visit: Payer: 59 | Admitting: Podiatry

## 2017-12-22 ENCOUNTER — Ambulatory Visit: Payer: 59 | Admitting: Podiatry

## 2018-10-13 ENCOUNTER — Other Ambulatory Visit (HOSPITAL_COMMUNITY)
Admission: RE | Admit: 2018-10-13 | Discharge: 2018-10-13 | Disposition: A | Discharge status: Home / Self Care | Payer: 59 | Source: Ambulatory Visit | Attending: Nurse Practitioner | Admitting: Nurse Practitioner

## 2018-10-13 ENCOUNTER — Encounter: Payer: Self-pay | Admitting: Nurse Practitioner

## 2018-10-13 ENCOUNTER — Ambulatory Visit (INDEPENDENT_AMBULATORY_CARE_PROVIDER_SITE_OTHER): Payer: 59 | Admitting: Nurse Practitioner

## 2018-10-13 VITALS — BP 130/82 | HR 86 | Temp 98.2°F | Ht 60.0 in | Wt 199.4 lb

## 2018-10-13 DIAGNOSIS — Z0000 Encounter for general adult medical examination without abnormal findings: Secondary | ICD-10-CM | POA: Diagnosis not present

## 2018-10-13 DIAGNOSIS — Z113 Encounter for screening for infections with a predominantly sexual mode of transmission: Secondary | ICD-10-CM | POA: Diagnosis not present

## 2018-10-13 DIAGNOSIS — Z1231 Encounter for screening mammogram for malignant neoplasm of breast: Secondary | ICD-10-CM | POA: Diagnosis not present

## 2018-10-13 DIAGNOSIS — Z124 Encounter for screening for malignant neoplasm of cervix: Secondary | ICD-10-CM | POA: Diagnosis not present

## 2018-10-13 LAB — POCT URINALYSIS DIPSTICK
Bilirubin, UA: NEGATIVE
Glucose, UA: NEGATIVE
Ketones, UA: NEGATIVE
Nitrite, UA: NEGATIVE
PH UA: 6 (ref 5.0–8.0)
PROTEIN UA: NEGATIVE
Spec Grav, UA: 1.025 (ref 1.010–1.025)
UROBILINOGEN UA: 0.2 U/dL

## 2018-10-13 NOTE — Patient Instructions (Signed)
Obesity, Adult Obesity is having too much body fat. If you have a BMI of 30 or more, you are obese. BMI is a number that explains how much body fat you have. Obesity is often caused by taking in (consuming) more calories than your body uses. Obesity can cause serious health problems. Changing your lifestyle can help to treat obesity. Follow these instructions at home: Eating and drinkingHealth Maintenance, Female Adopting a healthy lifestyle and getting preventive care can go a long way to promote health and wellness. Talk with your health care provider about what schedule of regular examinations is right for you. This is a good chance for you to check in with your provider about disease prevention and staying healthy. In between checkups, there are plenty of things you can do on your own. Experts have done a lot of research about which lifestyle changes and preventive measures are most likely to keep you healthy. Ask your health care provider for more information. Weight and diet Eat a healthy diet  Be sure to include plenty of vegetables, fruits, low-fat dairy products, and lean protein.  Do not eat a lot of foods high in solid fats, added sugars, or salt.  Get regular exercise. This is one of the most important things you can do for your health. ? Most adults should exercise for at least 150 minutes each week. The exercise should increase your heart rate and make you sweat (moderate-intensity exercise). ? Most adults should also do strengthening exercises at least twice a week. This is in addition to the moderate-intensity exercise. Maintain a healthy weight  Body mass index (BMI) is a measurement that can be used to identify possible weight problems. It estimates body fat based on height and weight. Your health care provider can help determine your BMI and help you achieve or maintain a healthy weight.  For females 77 years of age and older: ? A BMI below 18.5 is considered  underweight. ? A BMI of 18.5 to 24.9 is normal. ? A BMI of 25 to 29.9 is considered overweight. ? A BMI of 30 and above is considered obese. Watch levels of cholesterol and blood lipids  You should start having your blood tested for lipids and cholesterol at 45 years of age, then have this test every 5 years.  You may need to have your cholesterol levels checked more often if: ? Your lipid or cholesterol levels are high. ? You are older than 45 years of age. ? You are at high risk for heart disease. Cancer screening Lung Cancer  Lung cancer screening is recommended for adults 30-68 years old who are at high risk for lung cancer because of a history of smoking.  A yearly low-dose CT scan of the lungs is recommended for people who: ? Currently smoke. ? Have quit within the past 15 years. ? Have at least a 30-pack-year history of smoking. A pack year is smoking an average of one pack of cigarettes a day for 1 year.  Yearly screening should continue until it has been 15 years since you quit.  Yearly screening should stop if you develop a health problem that would prevent you from having lung cancer treatment. Breast Cancer  Practice breast self-awareness. This means understanding how your breasts normally appear and feel.  It also means doing regular breast self-exams. Let your health care provider know about any changes, no matter how small.  If you are in your 20s or 30s, you should have a clinical breast  exam (CBE) by a health care provider every 1-3 years as part of a regular health exam.  If you are 73 or older, have a CBE every year. Also consider having a breast X-ray (mammogram) every year.  If you have a family history of breast cancer, talk to your health care provider about genetic screening.  If you are at high risk for breast cancer, talk to your health care provider about having an MRI and a mammogram every year.  Breast cancer gene (BRCA) assessment is recommended  for women who have family members with BRCA-related cancers. BRCA-related cancers include: ? Breast. ? Ovarian. ? Tubal. ? Peritoneal cancers.  Results of the assessment will determine the need for genetic counseling and BRCA1 and BRCA2 testing. Cervical Cancer Your health care provider may recommend that you be screened regularly for cancer of the pelvic organs (ovaries, uterus, and vagina). This screening involves a pelvic examination, including checking for microscopic changes to the surface of your cervix (Pap test). You may be encouraged to have this screening done every 3 years, beginning at age 60.  For women ages 46-65, health care providers may recommend pelvic exams and Pap testing every 3 years, or they may recommend the Pap and pelvic exam, combined with testing for human papilloma virus (HPV), every 5 years. Some types of HPV increase your risk of cervical cancer. Testing for HPV may also be done on women of any age with unclear Pap test results.  Other health care providers may not recommend any screening for nonpregnant women who are considered low risk for pelvic cancer and who do not have symptoms. Ask your health care provider if a screening pelvic exam is right for you.  If you have had past treatment for cervical cancer or a condition that could lead to cancer, you need Pap tests and screening for cancer for at least 20 years after your treatment. If Pap tests have been discontinued, your risk factors (such as having a new sexual partner) need to be reassessed to determine if screening should resume. Some women have medical problems that increase the chance of getting cervical cancer. In these cases, your health care provider may recommend more frequent screening and Pap tests. Colorectal Cancer  This type of cancer can be detected and often prevented.  Routine colorectal cancer screening usually begins at 45 years of age and continues through 46 years of age.  Your health  care provider may recommend screening at an earlier age if you have risk factors for colon cancer.  Your health care provider may also recommend using home test kits to check for hidden blood in the stool.  A small camera at the end of a tube can be used to examine your colon directly (sigmoidoscopy or colonoscopy). This is done to check for the earliest forms of colorectal cancer.  Routine screening usually begins at age 49.  Direct examination of the colon should be repeated every 5-10 years through 45 years of age. However, you may need to be screened more often if early forms of precancerous polyps or small growths are found. Skin Cancer  Check your skin from head to toe regularly.  Tell your health care provider about any new moles or changes in moles, especially if there is a change in a mole's shape or color.  Also tell your health care provider if you have a mole that is larger than the size of a pencil eraser.  Always use sunscreen. Apply sunscreen liberally and repeatedly  throughout the day.  Protect yourself by wearing long sleeves, pants, a wide-brimmed hat, and sunglasses whenever you are outside. Heart disease, diabetes, and high blood pressure  High blood pressure causes heart disease and increases the risk of stroke. High blood pressure is more likely to develop in: ? People who have blood pressure in the high end of the normal range (130-139/85-89 mm Hg). ? People who are overweight or obese. ? People who are African American.  If you are 37-20 years of age, have your blood pressure checked every 3-5 years. If you are 32 years of age or older, have your blood pressure checked every year. You should have your blood pressure measured twice-once when you are at a hospital or clinic, and once when you are not at a hospital or clinic. Record the average of the two measurements. To check your blood pressure when you are not at a hospital or clinic, you can use: ? An automated  blood pressure machine at a pharmacy. ? A home blood pressure monitor.  If you are between 23 years and 45 years old, ask your health care provider if you should take aspirin to prevent strokes.  Have regular diabetes screenings. This involves taking a blood sample to check your fasting blood sugar level. ? If you are at a normal weight and have a low risk for diabetes, have this test once every three years after 45 years of age. ? If you are overweight and have a high risk for diabetes, consider being tested at a younger age or more often. Preventing infection Hepatitis B  If you have a higher risk for hepatitis B, you should be screened for this virus. You are considered at high risk for hepatitis B if: ? You were born in a country where hepatitis B is common. Ask your health care provider which countries are considered high risk. ? Your parents were born in a high-risk country, and you have not been immunized against hepatitis B (hepatitis B vaccine). ? You have HIV or AIDS. ? You use needles to inject street drugs. ? You live with someone who has hepatitis B. ? You have had sex with someone who has hepatitis B. ? You get hemodialysis treatment. ? You take certain medicines for conditions, including cancer, organ transplantation, and autoimmune conditions. Hepatitis C  Blood testing is recommended for: ? Everyone born from [birth year] through 1967. ? Anyone with known risk factors for hepatitis C. Sexually transmitted infections (STIs)  You should be screened for sexually transmitted infections (STIs) including gonorrhea and chlamydia if: ? You are sexually active and are younger than 45 years of age. ? You are older than 45 years of age and your health care provider tells you that you are at risk for this type of infection. ? Your sexual activity has changed since you were last screened and you are at an increased risk for chlamydia or gonorrhea. Ask your health care provider if you are at  risk.  If you do not have HIV, but are at risk, it may be recommended that you take a prescription medicine daily to prevent HIV infection. This is called pre-exposure prophylaxis (PrEP). You are considered at risk if: ? You are sexually active and do not regularly use condoms or know the HIV status of your partner(s). ? You take drugs by injection. ? You are sexually active with a partner who has HIV. Talk with your health care provider about whether you are at high risk of  being infected with HIV. If you choose to begin PrEP, you should first be tested for HIV. You should then be tested every 3 months for as long as you are taking PrEP. Pregnancy  If you are premenopausal and you may become pregnant, ask your health care provider about preconception counseling.  If you may become pregnant, take 400 to 800 micrograms (mcg) of folic acid every day.  If you want to prevent pregnancy, talk to your health care provider about birth control (contraception). Osteoporosis and menopause  Osteoporosis is a disease in which the bones lose minerals and strength with aging. This can result in serious bone fractures. Your risk for osteoporosis can be identified using a bone density scan.  If you are 48 years of age or older, or if you are at risk for osteoporosis and fractures, ask your health care provider if you should be screened.  Ask your health care provider whether you should take a calcium or vitamin D supplement to lower your risk for osteoporosis.  Menopause may have certain physical symptoms and risks.  Hormone replacement therapy may reduce some of these symptoms and risks. Talk to your health care provider about whether hormone replacement therapy is right for you. Follow these instructions at home:  Schedule regular health, dental, and eye exams.  Stay current with your immunizations.  Do not use any tobacco products including cigarettes, chewing tobacco, or electronic  cigarettes.  If you are pregnant, do not drink alcohol.  If you are breastfeeding, limit how much and how often you drink alcohol.  Limit alcohol intake to no more than 1 drink per day for nonpregnant women. One drink equals 12 ounces of beer, 5 ounces of wine, or 1 ounces of hard liquor.  Do not use street drugs.  Do not share needles.  Ask your health care provider for help if you need support or information about quitting drugs.  Tell your health care provider if you often feel depressed.  Tell your health care provider if you have ever been abused or do not feel safe at home. This information is not intended to replace advice given to you by your health care provider. Make sure you discuss any questions you have with your health care provider. Document Released: 04/12/2011 Document Revised: 03/04/2016 Document Reviewed: 07/01/2015 Elsevier Interactive Patient Education  2019 [employer].    Follow advice from your doctor about what to eat and drink. Your doctor may tell you to: ? Cut down on (limit) fast foods, sweets, and processed snack foods. ? Choose low-fat options. For example, choose low-fat milk instead of whole milk. ? Eat 5 or more servings of fruits or vegetables every day. ? Eat at home more often. This gives you more control over what you eat. ? Choose healthy foods when you eat out. ? Learn what a healthy portion size is. A portion size is the amount of a certain food that is healthy for you to eat at one time. This is different for each person. ? Keep low-fat snacks available. ? Avoid sugary drinks. These include soda, fruit juice, iced tea that is sweetened with sugar, and flavored milk. ? Eat a healthy breakfast.  Drink enough water to keep your pee (urine) clear or pale yellow.  Do not go without eating for long periods of time (do not fast).  Do not go on popular or trendy diets (fad diets). Physical Activity  Exercise often, as told by your doctor.  Ask your doctor: ?  What types of exercise are safe for you. ? How often you should exercise.  Warm up and stretch before being active.  Do slow stretching after being active (cool down).  Rest between times of being active. Lifestyle  Limit how much time you spend in front of your TV, computer, or video game system (be less sedentary).  Find ways to reward yourself that do not involve food.  Limit alcohol intake to no more than 1 drink a day for nonpregnant women and 2 drinks a day for men. One drink equals 12 oz of beer, 5 oz of wine, or 1 oz of hard liquor. General instructions  Keep a weight loss journal. This can help you keep track of: ? The food that you eat. ? The exercise that you do.  Take over-the-counter and prescription medicines only as told by your doctor.  Take vitamins and supplements only as told by your doctor.  Think about joining a support group. Your doctor may be able to help with this.  Keep all follow-up visits as told by your doctor. This is important. Contact a doctor if:  You cannot meet your weight loss goal after you have changed your diet and lifestyle for 6 weeks. This information is not intended to replace advice given to you by your health care provider. Make sure you discuss any questions you have with your health care provider. Document Released: 12/20/2011 Document Revised: 03/04/2016 Document Reviewed: 07/16/2015 Elsevier Interactive Patient Education  2019 [employer].

## 2018-10-13 NOTE — Progress Notes (Signed)
Subjective:     Patient ID: Janet Garrison , female    DOB: 05-19-1976 , 43 y.o.   MRN: [id number]   Chief Complaint  Patient presents with  . Annual Exam    The patient states she uses none for birth control. Last LMP was Patient's last menstrual period was 09/23/2018 (approximate).. Negative for Dysmenorrhea and Negative for Menorrhagia Mammogram has not been done.  Negative for: breast discharge, breast lump(s), breast pain and breast self exam.  Pertinent negatives include abnormal bleeding (hematology), anxiety, decreased libido, depression, difficulty falling sleep, dyspareunia, history of infertility, nocturia, sexual dysfunction, sleep disturbances, urinary incontinence, urinary urgency, vaginal discharge and vaginal itching. Diet regular. The patient states her exercise level is  none   . The patient's tobacco use is:  Social History   Tobacco Use  Smoking Status Passive Smoke Exposure - Never Smoker  Smokeless Tobacco Never Used  . She has been exposed to passive smoke. The patient's alcohol use is:  Social History   Substance and Sexual Activity  Alcohol Use Yes   Comment: occasional  Additional information: Last pap 10/13/2015, next one scheduled for 10/12/2018  HPI  Here for Health Maintenance   She fractured her left foot approximately 1 year ago    Past Medical History:  Diagnosis Date  . Medical history non-contributory      Family History  Problem Relation Age of Onset  . Hypertension Mother   . Cancer Father        bladder     Current Outpatient Medications:  .  fluconazole (DIFLUCAN) 150 MG tablet, Take first dose now repeat in 5 days, Disp: 2 tablet, Rfl: 0   No Known Allergies   Review of Systems  Constitutional: Negative.   HENT: Negative.   Eyes: Negative.   Respiratory: Negative.   Cardiovascular: Negative.   Gastrointestinal: Negative.   Endocrine: Negative.   Genitourinary: Negative.   Musculoskeletal: Negative.   Skin: Negative.    Allergic/Immunologic: Negative.   Neurological: Negative.   Hematological: Negative.   Psychiatric/Behavioral: Negative.      Today's Vitals   10/13/18 1034  BP: 130/82  Pulse: 86  Temp: 98.2 F (36.8 C)  TempSrc: Oral  SpO2: 99%  Weight: 199 lb 6.4 oz (90.4 kg)  Height: 5' (1.524 m)  PainSc: 0-No pain   Body mass index is 38.94 kg/m.   Objective:  Physical Exam Constitutional:      Appearance: She is well-developed. She is obese.  HENT:     Head: Normocephalic and atraumatic.     Right Ear: Hearing, tympanic membrane, ear canal and external ear normal.     Left Ear: Hearing, tympanic membrane, ear canal and external ear normal.     Nose: Nose normal.     Mouth/Throat:     Mouth: Mucous membranes are moist.  Eyes:     General: Lids are normal.     Conjunctiva/sclera: Conjunctivae normal.     Pupils: Pupils are equal, round, and reactive to light.     Funduscopic exam:    Right eye: No papilledema.        Left eye: No papilledema.  Neck:     Musculoskeletal: Full passive range of motion without pain, normal range of motion and neck supple.     Thyroid: No thyroid mass.     Vascular: No carotid bruit.  Cardiovascular:     Rate and Rhythm: Normal rate and regular rhythm.     Pulses: Normal pulses.  Heart sounds: Normal heart sounds. No murmur.  Pulmonary:     Effort: Pulmonary effort is normal.     Breath sounds: Normal breath sounds.  Chest:     Breasts:        Right: Normal. No mass, nipple discharge or tenderness.        Left: Normal. No mass, nipple discharge or tenderness.  Abdominal:     General: Bowel sounds are normal.     Palpations: Abdomen is soft.  Genitourinary:    Exam position: Lithotomy position.     Pubic Area: No rash.      Labia:        Right: No rash or tenderness.        Left: No rash or tenderness.      Vagina: Normal.     Cervix: Normal.     Uterus: Normal.      Adnexa: Right adnexa normal and left adnexa normal.       Right:  No mass or tenderness.         Left: No mass or tenderness.    Musculoskeletal: Normal range of motion.  Skin:    General: Skin is warm and dry.     Capillary Refill: Capillary refill takes less than 2 seconds.  Neurological:     General: No focal deficit present.     Mental Status: She is alert and oriented to person, place, and time.     Cranial Nerves: No cranial nerve deficit.     Sensory: No sensory deficit.  Psychiatric:        Mood and Affect: Mood normal.        Behavior: Behavior normal.        Thought Content: Thought content normal.        Judgment: Judgment normal.         Assessment And Plan:    1. Encounter for general adult medical examination w/o abnormal findings . Behavior modifications discussed and diet history reviewed.   . Pt will continue to exercise regularly and modify diet with low GI, plant based foods and decrease intake of processed foods.  . Recommend intake of daily multivitamin, Vitamin D, and calcium.  . Recommend mammogram for preventive screenings, as well as recommend immunizations that include influenza, TDAP - POCT Urinalysis Dipstick (81002) - Hemoglobin A1c - BMP8+eGFR - Lipid panel - CBC no Diff  2. Encounter for screening mammogram for breast cancer  Pt instructed on Self Breast Exam.According to ACOG guidelines Women aged 53 and older are recommended to get an annual mammogram. Form completed and given to patient contact the [facility] for appointment scheduing.   Pt encouraged to get annual mammogram - MM Digital Screening; Future  3. Encounter for Papanicolaou smear of cervix  PAP done no abnormal findings noted - Cytology -Pap Smear  4. Screening for STD (sexually transmitted disease)  - Cytology (oral, anal, urethral) ancillary only      Minette Brine, FNP

## 2018-10-14 LAB — BMP8+EGFR
BUN/Creatinine Ratio: 17 (ref 9–23)
BUN: 15 mg/dL (ref 6–24)
CO2: 22 mmol/L (ref 20–29)
Calcium: 9.3 mg/dL (ref 8.7–10.2)
Chloride: 104 mmol/L (ref 96–106)
Creatinine, Ser: 0.86 mg/dL (ref 0.57–1.00)
GFR calc Af Amer: 95 mL/min/{1.73_m2} (ref >59)
GFR, EST NON AFRICAN AMERICAN: 82 mL/min/{1.73_m2} (ref >59)
Glucose: 73 mg/dL (ref 65–99)
Potassium: 4.1 mmol/L (ref 3.5–5.2)
Sodium: 142 mmol/L (ref 134–144)

## 2018-10-14 LAB — CBC
HEMATOCRIT: 37.6 % (ref 34.0–46.6)
Hemoglobin: 12.6 g/dL (ref 11.1–15.9)
MCH: 30.4 pg (ref 26.6–33.0)
MCHC: 33.5 g/dL (ref 31.5–35.7)
MCV: 91 fL (ref 79–97)
Platelets: 376 10*3/uL (ref 150–450)
RBC: 4.14 x10E6/uL (ref 3.77–5.28)
RDW: 12.6 % (ref 12.3–15.4)
WBC: 6.4 10*3/uL (ref 3.4–10.8)

## 2018-10-14 LAB — LIPID PANEL
CHOL/HDL RATIO: 4 ratio (ref 0.0–4.4)
Cholesterol, Total: 203 mg/dL — ABNORMAL HIGH (ref 100–199)
HDL: 51 mg/dL (ref >39)
LDL Calculated: 135 mg/dL — ABNORMAL HIGH (ref 0–99)
Triglycerides: 86 mg/dL (ref 0–149)
VLDL Cholesterol Cal: 17 mg/dL (ref 5–40)

## 2018-10-14 LAB — HEMOGLOBIN A1C
Est. average glucose Bld gHb Est-mCnc: 114 mg/dL
HEMOGLOBIN A1C: 5.6 % (ref 4.8–5.6)

## 2018-10-17 ENCOUNTER — Telehealth: Payer: Self-pay

## 2018-10-17 LAB — CYTOLOGY, (ORAL, ANAL, URETHRAL) ANCILLARY ONLY
Candida vaginitis: POSITIVE — AB
Chlamydia: NEGATIVE
Neisseria Gonorrhea: NEGATIVE
Trichomonas: NEGATIVE

## 2018-10-17 NOTE — Telephone Encounter (Signed)
Patient called requesting lab results and also wanted to know If she needs an antibiotic or not? YRL,RMA

## 2018-10-18 LAB — CYTOLOGY - PAP: ADEQUACY: ABSENT — AB

## 2018-10-19 ENCOUNTER — Encounter: Payer: Self-pay | Admitting: Nurse Practitioner

## 2018-10-22 ENCOUNTER — Encounter: Payer: Self-pay | Admitting: Nurse Practitioner

## 2018-10-30 ENCOUNTER — Other Ambulatory Visit: Payer: Self-pay | Admitting: Nurse Practitioner

## 2018-10-30 MED ORDER — FLUCONAZOLE 150 MG PO TABS: ORAL_TABLET | ORAL | 0 refills | Status: DC

## 2019-01-01 ENCOUNTER — Encounter: Payer: Self-pay | Admitting: Nurse Practitioner

## 2019-01-01 ENCOUNTER — Ambulatory Visit (INDEPENDENT_AMBULATORY_CARE_PROVIDER_SITE_OTHER): Payer: 59 | Admitting: Nurse Practitioner

## 2019-01-01 ENCOUNTER — Other Ambulatory Visit: Payer: Self-pay

## 2019-01-01 VITALS — BP 130/72 | HR 80 | Temp 97.7°F | Ht 60.4 in | Wt 197.8 lb

## 2019-01-01 DIAGNOSIS — Z6838 Body mass index (BMI) 38.0-38.9, adult: Secondary | ICD-10-CM | POA: Diagnosis not present

## 2019-01-01 DIAGNOSIS — Z3042 Encounter for surveillance of injectable contraceptive: Secondary | ICD-10-CM

## 2019-01-01 DIAGNOSIS — E669 Obesity, unspecified: Secondary | ICD-10-CM | POA: Insufficient documentation

## 2019-01-01 MED ORDER — MEDROXYPROGESTERONE ACETATE 150 MG/ML IM SUSP: 150.0000 mg | INTRAMUSCULAR | 4 refills | Status: DC

## 2019-01-01 NOTE — Progress Notes (Signed)
  Subjective:     Patient ID: Janet Garrison , female    DOB: 05/26/1976 , 43 y.o.   MRN: [id number]   Chief Complaint  Patient presents with  . Contraception    depo  . Obesity    HPI  Patient's last menstrual period was 12/28/2018.  Would like to restart depo.  She was on depo for more than one year.    Obesity - she has been eating increased pastas.   Wt Readings from Last 3 Encounters: 01/01/19 : 197 lb 12.8 oz (89.7 kg) 10/13/18 : 199 lb 6.4 oz (90.4 kg) 08/04/17 : 188 lb (85.3 kg)    Past Medical History:  Diagnosis Date  . Medical history non-contributory      Family History  Problem Relation Age of Onset  . Hypertension Mother   . Cancer Father        bladder     Current Outpatient Medications:  .  fluconazole (DIFLUCAN) 150 MG tablet, Take first dose now repeat in 5 days (Patient not taking: Reported on 01/01/2019), Disp: 2 tablet, Rfl: 0   No Known Allergies   Review of Systems  Constitutional: Negative for fatigue.  Cardiovascular: Negative.  Negative for chest pain, palpitations and leg swelling.  Endocrine: Negative for polydipsia, polyphagia and polyuria.  Musculoskeletal: Negative.   Neurological: Negative for dizziness and headaches.     Today's Vitals   01/01/19 1510  BP: 130/72  Pulse: 80  Temp: 97.7 F (36.5 C)  TempSrc: Oral  SpO2: 98%  Weight: 197 lb 12.8 oz (89.7 kg)  Height: 5' 0.4" (1.534 m)   Body mass index is 38.12 kg/m.   Objective:  Physical Exam Vitals signs reviewed.  Constitutional:      Appearance: Normal appearance.  Cardiovascular:     Rate and Rhythm: Normal rate and regular rhythm.     Pulses: Normal pulses.     Heart sounds: Normal heart sounds. No murmur.  Pulmonary:     Effort: Pulmonary effort is normal. No respiratory distress.     Breath sounds: Normal breath sounds.  Skin:    General: Skin is warm and dry.     Capillary Refill: Capillary refill takes less than 2 seconds.  Neurological:   General: No focal deficit present.     Mental Status: She is alert and oriented to person, place, and time.  Psychiatric:        Mood and Affect: Mood normal.        Behavior: Behavior normal.        Thought Content: Thought content normal.        Judgment: Judgment normal.         Assessment And Plan:     1. Obesity (BMI 30-39.9)  Discussed with her options of medications to include qsymia and Saxenda  Discussed side effects of both  She would like to think more on which direction to go Discussed healthy diet and regular exercise options  Encouraged to exercise at least 150 minutes per week with 2 days of strength training Return in 2 months for weight check.   2. Encounter for Depo-Provera contraception  Negative pregnancy test - medroxyPROGESTERone (DEPO-PROVERA) 150 MG/ML injection; Inject 1 mL (150 mg total) into the muscle every 3 (three) months.  Dispense: 1 mL; Refill: 4 - medroxyPROGESTERone (DEPO-PROVERA) injection 150 mg    Arnette Felts, FNP

## 2019-01-01 NOTE — Patient Instructions (Signed)
   contrave  qsymia  saxenda   Each of these are weight loss medications, let me know which one your insurance covers.

## 2019-01-02 ENCOUNTER — Ambulatory Visit: Payer: 59

## 2019-01-02 DIAGNOSIS — Z6838 Body mass index (BMI) 38.0-38.9, adult: Secondary | ICD-10-CM | POA: Diagnosis not present

## 2019-01-02 DIAGNOSIS — Z3042 Encounter for surveillance of injectable contraceptive: Secondary | ICD-10-CM | POA: Diagnosis not present

## 2019-01-02 DIAGNOSIS — E669 Obesity, unspecified: Secondary | ICD-10-CM | POA: Diagnosis not present

## 2019-01-03 MED ORDER — MEDROXYPROGESTERONE ACETATE 150 MG/ML IM SUSP
150.0000 mg | Freq: Once | INTRAMUSCULAR | Status: AC
  Administered 2019-01-02: 150 mg via INTRAMUSCULAR

## 2019-01-22 ENCOUNTER — Encounter: Payer: Self-pay | Admitting: Nurse Practitioner

## 2019-01-23 ENCOUNTER — Other Ambulatory Visit: Payer: Self-pay | Admitting: Nurse Practitioner

## 2019-01-23 DIAGNOSIS — E669 Obesity, unspecified: Secondary | ICD-10-CM

## 2019-01-23 MED ORDER — PHENTERMINE-TOPIRAMATE ER 3.75-23 MG PO CP24: 1.0000 | ORAL_CAPSULE | Freq: Every day | ORAL | 0 refills | Status: DC

## 2019-03-26 ENCOUNTER — Ambulatory Visit (INDEPENDENT_AMBULATORY_CARE_PROVIDER_SITE_OTHER): Payer: 59 | Admitting: Nurse Practitioner

## 2019-03-26 ENCOUNTER — Encounter: Payer: Self-pay | Admitting: Nurse Practitioner

## 2019-03-26 ENCOUNTER — Other Ambulatory Visit: Payer: Self-pay

## 2019-03-26 VITALS — BP 128/80 | HR 88 | Temp 98.8°F | Ht 60.0 in | Wt 200.0 lb

## 2019-03-26 DIAGNOSIS — Z30013 Encounter for initial prescription of injectable contraceptive: Secondary | ICD-10-CM

## 2019-03-26 DIAGNOSIS — Z3042 Encounter for surveillance of injectable contraceptive: Secondary | ICD-10-CM

## 2019-03-26 DIAGNOSIS — E669 Obesity, unspecified: Secondary | ICD-10-CM

## 2019-03-26 DIAGNOSIS — Z3202 Encounter for pregnancy test, result negative: Secondary | ICD-10-CM | POA: Diagnosis not present

## 2019-03-26 MED ORDER — MEDROXYPROGESTERONE ACETATE 150 MG/ML IM SUSP
150.0000 mg | Freq: Once | INTRAMUSCULAR | Status: AC
  Administered 2019-03-26: 150 mg via INTRAMUSCULAR

## 2019-03-26 NOTE — Progress Notes (Signed)
  Subjective:     Patient ID: Janet Garrison , female    DOB: February 04, 1976 , 43 y.o.   MRN: [id number]   Chief Complaint  Patient presents with  . Contraception    HPI  Here today for Depo injection.    Obesity - she has not started her 14 day course of Qsymia.  She does admit to eating unhealthy with the recent Quarantine.      Past Medical History:  Diagnosis Date  . Medical history non-contributory      Family History  Problem Relation Age of Onset  . Hypertension Mother   . Cancer Father        bladder     Current Outpatient Medications:  .  medroxyPROGESTERone (DEPO-PROVERA) 150 MG/ML injection, Inject 1 mL (150 mg total) into the muscle every 3 (three) months., Disp: 1 mL, Rfl: 4 .  Phentermine-Topiramate (QSYMIA) 3.75-23 MG CP24, Take 1 tablet by mouth daily. (Patient not taking: Reported on 03/26/2019), Disp: 14 capsule, Rfl: 0   No Known Allergies   Review of Systems  Constitutional: Negative.  Negative for fatigue.  Respiratory: Negative.   Cardiovascular: Negative.   Gastrointestinal: Negative.   Endocrine: Negative for polydipsia, polyphagia and polyuria.  Neurological: Negative for dizziness and headaches.  Psychiatric/Behavioral: Negative.      Today's Vitals   03/26/19 0902  BP: 128/80  Pulse: 88  Temp: 98.8 F (37.1 C)  TempSrc: Oral  Weight: 200 lb (90.7 kg)  Height: 5' (1.524 m)  PainSc: 0-No pain   Body mass index is 39.06 kg/m.   Objective:  Physical Exam Vitals signs reviewed.  Constitutional:      Appearance: Normal appearance.  Cardiovascular:     Rate and Rhythm: Normal rate and regular rhythm.     Pulses: Normal pulses.     Heart sounds: Normal heart sounds. No murmur.  Pulmonary:     Effort: Pulmonary effort is normal. No respiratory distress.  Skin:    General: Skin is warm and dry.     Capillary Refill: Capillary refill takes less than 2 seconds.  Neurological:     General: No focal deficit present.     Mental  Status: She is alert and oriented to person, place, and time.  Psychiatric:        Mood and Affect: Mood normal.        Behavior: Behavior normal.        Thought Content: Thought content normal.        Judgment: Judgment normal.         Assessment And Plan:     1. Encounter for Depo-Provera contraception  Negative pregnancy test  Depo administered today - POCT Urine Pregnancy  2. Encounter for initial prescription of injectable contraceptive  - POCT Urine Pregnancy   3. Obesity (BMI 30-39.9)  She has not been taking the Qsymia and plans to start next week  Encouraged to avoid eating fried foods and to eat a healthy diet.       Minette Brine, FNP    THE PATIENT IS ENCOURAGED TO PRACTICE SOCIAL DISTANCING DUE TO THE COVID-19 PANDEMIC.

## 2019-03-27 ENCOUNTER — Telehealth: Payer: Self-pay

## 2019-03-27 NOTE — Telephone Encounter (Signed)
Rx for Verne Carrow has been faxed to [facility] at [phone] on 06/15. YRL,RMA

## 2019-04-04 LAB — POCT URINE PREGNANCY: Preg Test, Ur: NEGATIVE

## 2019-04-12 ENCOUNTER — Ambulatory Visit: Payer: 59 | Admitting: Nurse Practitioner

## 2019-05-25 ENCOUNTER — Other Ambulatory Visit: Payer: Self-pay | Admitting: Nurse Practitioner

## 2019-05-25 DIAGNOSIS — Z3042 Encounter for surveillance of injectable contraceptive: Secondary | ICD-10-CM

## 2019-05-28 ENCOUNTER — Ambulatory Visit: Payer: 59 | Admitting: Nurse Practitioner

## 2019-06-21 ENCOUNTER — Encounter: Payer: Self-pay | Admitting: Nurse Practitioner

## 2019-06-21 ENCOUNTER — Other Ambulatory Visit: Payer: Self-pay

## 2019-06-21 ENCOUNTER — Ambulatory Visit (INDEPENDENT_AMBULATORY_CARE_PROVIDER_SITE_OTHER): Payer: 59 | Admitting: Nurse Practitioner

## 2019-06-21 VITALS — BP 138/82 | HR 96 | Temp 98.2°F | Ht 60.0 in | Wt 204.8 lb

## 2019-06-21 DIAGNOSIS — Z3202 Encounter for pregnancy test, result negative: Secondary | ICD-10-CM

## 2019-06-21 DIAGNOSIS — Z23 Encounter for immunization: Secondary | ICD-10-CM

## 2019-06-21 DIAGNOSIS — E782 Mixed hyperlipidemia: Secondary | ICD-10-CM | POA: Diagnosis not present

## 2019-06-21 DIAGNOSIS — Z3042 Encounter for surveillance of injectable contraceptive: Secondary | ICD-10-CM | POA: Diagnosis not present

## 2019-06-21 DIAGNOSIS — R7303 Prediabetes: Secondary | ICD-10-CM | POA: Diagnosis not present

## 2019-06-21 DIAGNOSIS — Z139 Encounter for screening, unspecified: Secondary | ICD-10-CM

## 2019-06-21 DIAGNOSIS — E669 Obesity, unspecified: Secondary | ICD-10-CM | POA: Diagnosis not present

## 2019-06-21 LAB — POCT URINE PREGNANCY: Preg Test, Ur: NEGATIVE

## 2019-06-21 MED ORDER — TETANUS-DIPHTH-ACELL PERTUSSIS 5-2.5-18.5 LF-MCG/0.5 IM SUSP
0.5000 mL | Freq: Once | INTRAMUSCULAR | Status: AC
  Administered 2019-06-21: 0.5 mL via INTRAMUSCULAR

## 2019-06-21 MED ORDER — MEDROXYPROGESTERONE ACETATE 150 MG/ML IM SUSP
150.0000 mg | Freq: Once | INTRAMUSCULAR | Status: AC
  Administered 2019-06-21: 150 mg via INTRAMUSCULAR

## 2019-06-21 NOTE — Progress Notes (Signed)
Subjective:     Patient ID: Janet Garrison , female    DOB: 07/26/1976 , 43 y.o.   MRN: [id number]   Chief Complaint  Patient presents with  . Hyperlipidemia  . PREDIABETES  . Weight Check  . Contraception    HPI  Here today for Depo injection.    Obesity - she started the Qsymia 14 day trial, she did not receive a call from [facility]. Currently trying to eat more vegetables. Yesterday - egg bites from [employer], no lunch. Steak pitas.  Soda regular (Mtn Dew) and coffee.  Does not drink water regularly.    Wt Readings from Last 3 Encounters: 06/21/19 : 204 lb 12.8 oz (92.9 kg) 03/26/19 : 200 lb (90.7 kg) 01/01/19 : 197 lb 12.8 oz (89.7 kg)   Hyperlipidemia This is a new problem. The current episode started more than 1 year ago. The problem is controlled. Recent lipid tests were reviewed and are normal. She has no history of chronic renal disease. There are no known factors aggravating her hyperlipidemia. There are no compliance problems.  Risk factors for coronary artery disease include obesity and a sedentary lifestyle (She is doing more walking and taking stairs more.  She will walk to the other building at work. ).     Past Medical History:  Diagnosis Date  . Medical history non-contributory      Family History  Problem Relation Age of Onset  . Hypertension Mother   . Cancer Father        bladder     Current Outpatient Medications:  .  medroxyPROGESTERone (DEPO-PROVERA) 150 MG/ML injection, Inject 1 mL (150 mg total) into the muscle every 3 (three) months., Disp: 1 mL, Rfl: 4 .  Phentermine-Topiramate (QSYMIA) 3.75-23 MG CP24, Take 1 tablet by mouth daily. (Patient not taking: Reported on 06/21/2019), Disp: 14 capsule, Rfl: 0   No Known Allergies   Review of Systems  Constitutional: Negative.  Negative for fatigue.  Respiratory: Negative.   Cardiovascular: Negative.   Gastrointestinal: Negative.   Endocrine: Negative for polydipsia, polyphagia and polyuria.   Neurological: Negative for dizziness and headaches.  Psychiatric/Behavioral: Negative.      Today's Vitals   06/21/19 1453  BP: 138/82  Pulse: 96  Temp: 98.2 F (36.8 C)  TempSrc: Oral  Weight: 204 lb 12.8 oz (92.9 kg)  Height: 5' (1.524 m)  PainSc: 0-No pain   Body mass index is 40 kg/m.   Objective:  Physical Exam Vitals signs reviewed.  Constitutional:      General: She is not in acute distress.    Appearance: Normal appearance. She is obese.  Cardiovascular:     Rate and Rhythm: Normal rate and regular rhythm.     Pulses: Normal pulses.     Heart sounds: Normal heart sounds. No murmur.  Pulmonary:     Effort: Pulmonary effort is normal. No respiratory distress.  Skin:    General: Skin is warm and dry.     Capillary Refill: Capillary refill takes less than 2 seconds.  Neurological:     General: No focal deficit present.     Mental Status: She is alert and oriented to person, place, and time.  Psychiatric:        Mood and Affect: Mood normal.        Behavior: Behavior normal.        Thought Content: Thought content normal.        Judgment: Judgment normal.  Assessment And Plan:     1. Mixed hyperlipidemia  Chronic, controlled  Continue with current medications  Continue to avoid fried and fatty foods - Lipid panel  2. Obesity (BMI 30-39.9)  Chronic  Discussed healthy diet and regular exercise options   Encouraged to exercise at least 150 minutes per week with 2 days of strength training  Will check her HgbA1c due to increased risk for diabetes with obesity, she has had a 4lb weight gain since June - Hemoglobin A1c  3. Encounter for Depo-Provera contraception  Negative pregnancy test  Depo injection given in office - POCT Urine Pregnancy - medroxyPROGESTERone (DEPO-PROVERA) injection 150 mg  4. Encounter for immunization  Will give tetanus vaccine today while in office. Refer to order management. TDAP will be administered to  adults 40-54 years old every 10 years. - Tdap (BOOSTRIX) injection 0.5 mL  5. Encounter for screening  - HIV antibody (with reflex)   Minette Brine, FNP    THE PATIENT IS ENCOURAGED TO PRACTICE SOCIAL DISTANCING DUE TO THE COVID-19 PANDEMIC.

## 2019-06-22 LAB — LIPID PANEL
Chol/HDL Ratio: 5.4 ratio — ABNORMAL HIGH (ref 0.0–4.4)
Cholesterol, Total: 209 mg/dL — ABNORMAL HIGH (ref 100–199)
HDL: 39 mg/dL — ABNORMAL LOW (ref >39)
LDL Chol Calc (NIH): 149 mg/dL — ABNORMAL HIGH (ref 0–99)
Triglycerides: 115 mg/dL (ref 0–149)
VLDL Cholesterol Cal: 21 mg/dL (ref 5–40)

## 2019-06-22 LAB — HIV ANTIBODY (ROUTINE TESTING W REFLEX): HIV Screen 4th Generation wRfx: NONREACTIVE

## 2019-06-22 LAB — HEMOGLOBIN A1C
Est. average glucose Bld gHb Est-mCnc: 114 mg/dL
Hgb A1c MFr Bld: 5.6 % (ref 4.8–5.6)

## 2019-09-03 ENCOUNTER — Encounter: Payer: Self-pay | Admitting: Nurse Practitioner

## 2019-09-10 ENCOUNTER — Encounter (HOSPITAL_COMMUNITY): Payer: Self-pay

## 2019-09-10 ENCOUNTER — Other Ambulatory Visit: Payer: Self-pay

## 2019-09-10 ENCOUNTER — Ambulatory Visit (HOSPITAL_COMMUNITY)
Admission: EM | Admit: 2019-09-10 | Discharge: 2019-09-10 | Disposition: A | Discharge status: Home / Self Care | Payer: 59 | Attending: Emergency Medicine | Admitting: Emergency Medicine

## 2019-09-10 DIAGNOSIS — R05 Cough: Secondary | ICD-10-CM

## 2019-09-10 DIAGNOSIS — U071 COVID-19: Secondary | ICD-10-CM

## 2019-09-10 DIAGNOSIS — R059 Cough, unspecified: Secondary | ICD-10-CM

## 2019-09-10 DIAGNOSIS — R0602 Shortness of breath: Secondary | ICD-10-CM

## 2019-09-10 MED ORDER — BENZONATATE 100 MG PO CAPS: 100.0000 mg | ORAL_CAPSULE | Freq: Three times a day (TID) | ORAL | 0 refills | Status: DC

## 2019-09-10 MED ORDER — ALBUTEROL SULFATE HFA 108 (90 BASE) MCG/ACT IN AERS: 1.0000 | INHALATION_SPRAY | Freq: Four times a day (QID) | RESPIRATORY_TRACT | 0 refills | Status: DC | PRN

## 2019-09-10 NOTE — Discharge Instructions (Signed)
You should remain isolated in your home for 10 days from symptom onset AND greater than 72 hours after symptoms resolution (absence of fever without the use of fever-reducing medication and improvement in respiratory symptoms), whichever is longer Get plenty of rest and push fluids Tessalon Perles prescribed for cough Pro-Air prescribed for SOB Use medications daily for symptom relief Use OTC medications like ibuprofen or tylenol as needed fever or pain Call or go to the ED if you have any new or worsening symptoms such as fever, worsening cough, shortness of breath, chest tightness, chest pain, turning blue, changes in mental status, etc..Marland Kitchen

## 2019-09-10 NOTE — ED Triage Notes (Signed)
Patient presents to Urgent Care with complaints of shortness of breath with exertion since 7 days ago. Patient reports she does not have medication at home to help her symptoms.

## 2019-09-10 NOTE — ED Provider Notes (Signed)
[facility]    CSN: [id number] Arrival date & time: 09/10/19  1421      History   Chief Complaint Chief Complaint  Patient presents with  . Shortness of Breath    HPI Janet Garrison is a 43 y.o. female.   43 y old female presented to the urgent care with a complaint of shortness of breath  And cough X 7 days. She tested positive on 11/23. She stated she hasn't been using any medications. Denied fever, chills chest pain and tightness, nausea, vomiting and diarrhea  The history is provided by the patient. No language interpreter was used.    Past Medical History:  Diagnosis Date  . Medical history non-contributory     Patient Active Problem List   Diagnosis Date Noted  . Obesity (BMI 30-39.9) 01/01/2019  . Spontaneous abortion in first trimester 05/16/2013  . OBESITY 03/02/2010  . NABOTHIAN CYST 01/17/2007    Past Surgical History:  Procedure Laterality Date  . NO PAST SURGERIES      OB History    Gravida  3   Para  1   Term      Preterm  1   AB  1   Living  1     SAB      TAB  1   Ectopic      Multiple      Live Births               Home Medications    Prior to Admission medications   Medication Sig Start Date End Date Taking? Authorizing Provider  albuterol (VENTOLIN HFA) 108 (90 Base) MCG/ACT inhaler Inhale 1-2 puffs into the lungs every 6 (six) hours as needed for wheezing or shortness of breath. 09/10/19   Suzanna Zahn, Zachery Dakins, FNP  benzonatate (TESSALON) 100 MG capsule Take 1 capsule (100 mg total) by mouth every 8 (eight) hours. 09/10/19   Ambert Virrueta, Zachery Dakins, FNP  medroxyPROGESTERone (DEPO-PROVERA) 150 MG/ML injection Inject 1 mL (150 mg total) into the muscle every 3 (three) months. 01/01/19   Arnette Felts, FNP  Phentermine-Topiramate (QSYMIA) 3.75-23 MG CP24 Take 1 tablet by mouth daily. Patient not taking: Reported on 06/21/2019 01/23/19   Arnette Felts, FNP    Family History Family History  Problem Relation  Age of Onset  . Hypertension Mother   . Cancer Father        bladder    Social History Social History   Tobacco Use  . Smoking status: Passive Smoke Exposure - Never Smoker  . Smokeless tobacco: Never Used  Substance Use Topics  . Alcohol use: Yes    Comment: occasional  . Drug use: No     Allergies   Patient has no known allergies.   Review of Systems Review of Systems  Constitutional: Negative for activity change, chills, fatigue and fever.  HENT: Negative for congestion, ear pain, sinus pressure, sinus pain, sneezing and sore throat.   Respiratory: Positive for cough and shortness of breath.   Gastrointestinal: Negative.   ROS: All other are negatives   Physical Exam Triage Vital Signs ED Triage Vitals  Enc Vitals Group     BP 09/10/19 1519 119/90     Pulse Rate 09/10/19 1519 91     Resp 09/10/19 1519 18     Temp 09/10/19 1519 97.8 F (36.6 C)     Temp Source 09/10/19 1519 Oral     SpO2 09/10/19 1519 97 %     Weight --  Height --      Head Circumference --      Peak Flow --      Pain Score 09/10/19 1517 0     Pain Loc --      Pain Edu? --      Excl. in [place]? --    No data found.  Updated Vital Signs BP 119/90 (BP Location: Left Arm)   Pulse 91   Temp 97.8 F (36.6 C) (Oral)   Resp 18   SpO2 97%   Visual Acuity Right Eye Distance:   Left Eye Distance:   Bilateral Distance:    Right Eye Near:   Left Eye Near:    Bilateral Near:     Physical Exam Constitutional:      General: She is not in acute distress.    Appearance: Normal appearance. She is well-developed and normal weight. She is not ill-appearing or toxic-appearing.  HENT:     Head: Normocephalic.     Right Ear: Tympanic membrane, ear canal and external ear normal. There is no impacted cerumen.     Left Ear: Tympanic membrane, ear canal and external ear normal. There is no impacted cerumen.     Nose: Nose normal. No congestion.     Mouth/Throat:     Mouth: Mucous membranes are  moist.     Pharynx: No oropharyngeal exudate.  Cardiovascular:     Rate and Rhythm: Normal rate and regular rhythm.     Pulses: Normal pulses.     Heart sounds: Normal heart sounds. No murmur.  Pulmonary:     Effort: Pulmonary effort is normal. No respiratory distress.     Breath sounds: Normal breath sounds. No rhonchi.  Chest:     Chest wall: No tenderness.  Abdominal:     General: Abdomen is flat. Bowel sounds are normal.     Palpations: Abdomen is soft.  Skin:    Capillary Refill: Capillary refill takes less than 2 seconds.  Neurological:     Mental Status: She is alert.      UC Treatments / Results  Labs (all labs ordered are listed, but only abnormal results are displayed) Labs Reviewed - No data to display  EKG   Radiology No results found.  Procedures Procedures (including critical care time)  Medications Ordered in UC Medications - No data to display  Initial Impression / Assessment and Plan / UC Course  I have reviewed the triage vital signs and the nursing notes.  Pertinent labs & imaging results that were available during my care of the patient were reviewed by me and considered in my medical decision making (see chart for details).   Patient is stable at discharge and was advised to take her medication as prescribed. To go to ED if symptom worsen Final Clinical Impressions(s) / UC Diagnoses   Final diagnoses:  COVID-19 virus infection  Cough     Discharge Instructions     You should remain isolated in your home for 10 days from symptom onset AND greater than 72 hours after symptoms resolution (absence of fever without the use of fever-reducing medication and improvement in respiratory symptoms), whichever is longer Get plenty of rest and push fluids Tessalon Perles prescribed for cough Pro-Air prescribed for SOB Use medications daily for symptom relief Use OTC medications like ibuprofen or tylenol as needed fever or pain Call or go to the ED  if you have any new or worsening symptoms such as fever, worsening cough, shortness of breath,  chest tightness, chest pain, turning blue, changes in mental status, etc...    ED Prescriptions    Medication Sig Dispense Auth. Provider   benzonatate (TESSALON) 100 MG capsule Take 1 capsule (100 mg total) by mouth every 8 (eight) hours. 21 capsule Enid Maultsby S, FNP   albuterol (VENTOLIN HFA) 108 (90 Base) MCG/ACT inhaler Inhale 1-2 puffs into the lungs every 6 (six) hours as needed for wheezing or shortness of breath. 16 g Durward ParcelAvegno, Claudie Brickhouse S, FNP     PDMP not reviewed this encounter.   Durward Parcelvegno, Antavia Tandy S, FNP 09/10/19 [phone]

## 2019-10-19 ENCOUNTER — Encounter: Payer: 59 | Admitting: Nurse Practitioner

## 2019-10-23 ENCOUNTER — Other Ambulatory Visit: Payer: Self-pay

## 2019-10-23 ENCOUNTER — Encounter: Payer: Self-pay | Admitting: Nurse Practitioner

## 2019-10-23 ENCOUNTER — Ambulatory Visit (INDEPENDENT_AMBULATORY_CARE_PROVIDER_SITE_OTHER): Payer: 59 | Admitting: Nurse Practitioner

## 2019-10-23 VITALS — BP 136/82 | HR 84 | Temp 98.4°F | Ht 60.2 in | Wt 195.0 lb

## 2019-10-23 DIAGNOSIS — Z3042 Encounter for surveillance of injectable contraceptive: Secondary | ICD-10-CM

## 2019-10-23 DIAGNOSIS — Z3202 Encounter for pregnancy test, result negative: Secondary | ICD-10-CM

## 2019-10-23 DIAGNOSIS — E782 Mixed hyperlipidemia: Secondary | ICD-10-CM

## 2019-10-23 DIAGNOSIS — R61 Generalized hyperhidrosis: Secondary | ICD-10-CM

## 2019-10-23 DIAGNOSIS — E669 Obesity, unspecified: Secondary | ICD-10-CM

## 2019-10-23 DIAGNOSIS — Z0000 Encounter for general adult medical examination without abnormal findings: Secondary | ICD-10-CM

## 2019-10-23 DIAGNOSIS — Z1231 Encounter for screening mammogram for malignant neoplasm of breast: Secondary | ICD-10-CM

## 2019-10-23 DIAGNOSIS — Z6837 Body mass index (BMI) 37.0-37.9, adult: Secondary | ICD-10-CM

## 2019-10-23 DIAGNOSIS — Z8616 Personal history of COVID-19: Secondary | ICD-10-CM

## 2019-10-23 LAB — POCT URINE PREGNANCY: Preg Test, Ur: NEGATIVE

## 2019-10-23 LAB — POCT URINALYSIS DIPSTICK
Bilirubin, UA: NEGATIVE
Blood, UA: NEGATIVE
Glucose, UA: NEGATIVE
Ketones, UA: NEGATIVE
Leukocytes, UA: NEGATIVE
Nitrite, UA: NEGATIVE
Protein, UA: POSITIVE — AB
Spec Grav, UA: 1.025 (ref 1.010–1.025)
Urobilinogen, UA: 1 E.U./dL
pH, UA: 7 (ref 5.0–8.0)

## 2019-10-23 MED ORDER — SAXENDA 18 MG/3ML ~~LOC~~ SOPN: 3.0000 mL | PEN_INJECTOR | Freq: Every day | SUBCUTANEOUS | 1 refills | Status: DC

## 2019-10-23 MED ORDER — MEDROXYPROGESTERONE ACETATE 150 MG/ML IM SUSP
150.0000 mg | Freq: Once | INTRAMUSCULAR | Status: AC
  Administered 2019-10-23: 12:00:00 150 mg via INTRAMUSCULAR

## 2019-10-23 NOTE — Patient Instructions (Signed)
Health Maintenance  Topic Date Due  . INFLUENZA VACCINE  01/09/2020 (Originally 05/12/2019)  . PAP SMEAR-Modifier  10/13/2021  . TETANUS/TDAP  06/20/2029  . HIV Screening  Completed   Health Maintenance, Female Adopting a healthy lifestyle and getting preventive care are important in promoting health and wellness. Ask your health care provider about:  The right schedule for you to have regular tests and exams.  Things you can do on your own to prevent diseases and keep yourself healthy. What should I know about diet, weight, and exercise? Eat a healthy diet   Eat a diet that includes plenty of vegetables, fruits, low-fat dairy products, and lean protein.  Do not eat a lot of foods that are high in solid fats, added sugars, or sodium. Maintain a healthy weight Body mass index (BMI) is used to identify weight problems. It estimates body fat based on height and weight. Your health care provider can help determine your BMI and help you achieve or maintain a healthy weight. Get regular exercise Get regular exercise. This is one of the most important things you can do for your health. Most adults should:  Exercise for at least 150 minutes each week. The exercise should increase your heart rate and make you sweat (moderate-intensity exercise).  Do strengthening exercises at least twice a week. This is in addition to the moderate-intensity exercise.  Spend less time sitting. Even light physical activity can be beneficial. Watch cholesterol and blood lipids Have your blood tested for lipids and cholesterol at 46 years of age, then have this test every 5 years. Have your cholesterol levels checked more often if:  Your lipid or cholesterol levels are high.  You are older than 46 years of age.  You are at high risk for heart disease. What should I know about cancer screening? Depending on your health history and family history, you may need to have cancer screening at various ages. This may  include screening for:  Breast cancer.  Cervical cancer.  Colorectal cancer.  Skin cancer.  Lung cancer. What should I know about heart disease, diabetes, and high blood pressure? Blood pressure and heart disease  High blood pressure causes heart disease and increases the risk of stroke. This is more likely to develop in people who have high blood pressure readings, are of African descent, or are overweight.  Have your blood pressure checked: ? Every 3-5 years if you are 23-54 years of age. ? Every year if you are 91 years old or older. Diabetes Have regular diabetes screenings. This checks your fasting blood sugar level. Have the screening done:  Once every three years after age 25 if you are at a normal weight and have a low risk for diabetes.  More often and at a younger age if you are overweight or have a high risk for diabetes. What should I know about preventing infection? Hepatitis B If you have a higher risk for hepatitis B, you should be screened for this virus. Talk with your health care provider to find out if you are at risk for hepatitis B infection. Hepatitis C Testing is recommended for:  Everyone born from [birth year] through 1967.  Anyone with known risk factors for hepatitis C. Sexually transmitted infections (STIs)  Get screened for STIs, including gonorrhea and chlamydia, if: ? You are sexually active and are younger than 46 years of age. ? You are older than 46 years of age and your health care provider tells you that you are at  risk for this type of infection. ? Your sexual activity has changed since you were last screened, and you are at increased risk for chlamydia or gonorrhea. Ask your health care provider if you are at risk.  Ask your health care provider about whether you are at high risk for HIV. Your health care provider may recommend a prescription medicine to help prevent HIV infection. If you choose to take medicine to prevent HIV, you should first  get tested for HIV. You should then be tested every 3 months for as long as you are taking the medicine. Pregnancy  If you are about to stop having your period (premenopausal) and you may become pregnant, seek counseling before you get pregnant.  Take 400 to 800 micrograms (mcg) of folic acid every day if you become pregnant.  Ask for birth control (contraception) if you want to prevent pregnancy. Osteoporosis and menopause Osteoporosis is a disease in which the bones lose minerals and strength with aging. This can result in bone fractures. If you are 12 years old or older, or if you are at risk for osteoporosis and fractures, ask your health care provider if you should:  Be screened for bone loss.  Take a calcium or vitamin D supplement to lower your risk of fractures.  Be given hormone replacement therapy (HRT) to treat symptoms of menopause. Follow these instructions at home: Lifestyle  Do not use any products that contain nicotine or tobacco, such as cigarettes, e-cigarettes, and chewing tobacco. If you need help quitting, ask your health care provider.  Do not use street drugs.  Do not share needles.  Ask your health care provider for help if you need support or information about quitting drugs. Alcohol use  Do not drink alcohol if: ? Your health care provider tells you not to drink. ? You are pregnant, may be pregnant, or are planning to become pregnant.  If you drink alcohol: ? Limit how much you use to 0-1 drink a day. ? Limit intake if you are breastfeeding.  Be aware of how much alcohol is in your drink. In the [place] one drink equals one 12 oz bottle of beer (355 mL), one 5 oz glass of wine (148 mL), or one 1 oz glass of hard liquor (44 mL). General instructions  Schedule regular health, dental, and eye exams.  Stay current with your vaccines.  Tell your health care provider if: ? You often feel depressed. ? You have ever been abused or do not feel safe at  home. Summary  Adopting a healthy lifestyle and getting preventive care are important in promoting health and wellness.  Follow your health care provider's instructions about healthy diet, exercising, and getting tested or screened for diseases.  Follow your health care provider's instructions on monitoring your cholesterol and blood pressure. This information is not intended to replace advice given to you by your health care provider. Make sure you discuss any questions you have with your health care provider. Document Revised: 09/20/2018 Document Reviewed: 09/20/2018 Elsevier Patient Education  2020 [employer].

## 2019-10-23 NOTE — Progress Notes (Addendum)
This visit occurred during the SARS-CoV-2 public health emergency.  Safety protocols were in place, including screening questions prior to the visit, additional usage of staff PPE, and extensive cleaning of exam room while observing appropriate contact time as indicated for disinfecting solutions.  Subjective:     Patient ID: Janet Garrison , female    DOB: 1975/10/22 , 44 y.o.   MRN: [id number]   Chief Complaint  Patient presents with  . Annual Exam    HPI  Here for HM and Depo  Had covid Nov 20-21st. She had been tested at work [employer].  2nd and 3rd day was the worse.  She did not get the coronavirus vaccine yet, the residents received the vaccine.    Wt Readings from Last 3 Encounters: 10/23/19 : 195 lb (88.5 kg) 06/21/19 : 204 lb 12.8 oz (92.9 kg) 03/26/19 : 200 lb (90.7 kg)    The patient states she uses Depo-Provera injections for birth control. Mammogram last done - unknown - did not go last year.  Negative for: breast discharge, breast lump(s), breast pain and breast self exam.  Pertinent negatives include abnormal bleeding (hematology), anxiety, decreased libido, depression, difficulty falling sleep, dyspareunia, history of infertility, nocturia, sexual dysfunction, sleep disturbances, urinary incontinence, urinary urgency, vaginal discharge and vaginal itching. Diet regular.  The patient states her exercise level is minimal - she does a lot of walking at work.      The patient's tobacco use is:  Social History   Tobacco Use  Smoking Status Passive Smoke Exposure - Never Smoker  Smokeless Tobacco Never Used   She has been exposed to passive smoke. The patient's alcohol use is:  Social History   Substance and Sexual Activity  Alcohol Use Yes   Comment: occasional   Additional information: Last pap 2020,  next one scheduled for 2023.  Past Medical History:  Diagnosis Date  . Medical history non-contributory      Family History  Problem Relation Age of  Onset  . Hypertension Mother   . Cancer Father        bladder     Current Outpatient Medications:  .  albuterol (VENTOLIN HFA) 108 (90 Base) MCG/ACT inhaler, Inhale 1-2 puffs into the lungs every 6 (six) hours as needed for wheezing or shortness of breath., Disp: 16 g, Rfl: 0 .  medroxyPROGESTERone (DEPO-PROVERA) 150 MG/ML injection, Inject 1 mL (150 mg total) into the muscle every 3 (three) months., Disp: 1 mL, Rfl: 4 .  benzonatate (TESSALON) 100 MG capsule, Take 1 capsule (100 mg total) by mouth every 8 (eight) hours., Disp: 21 capsule, Rfl: 0 .  Liraglutide -Weight Management (SAXENDA) 18 MG/3ML SOPN, Inject 3 mLs into the skin daily., Disp: 5 pen, Rfl: 1   No Known Allergies   Review of Systems  Constitutional: Negative.   HENT: Negative.   Eyes: Negative.   Respiratory: Negative.   Cardiovascular: Negative.   Gastrointestinal: Negative.   Endocrine: Negative.   Genitourinary: Negative.   Musculoskeletal: Negative.   Skin: Negative.   Allergic/Immunologic: Negative.   Neurological: Negative.   Hematological: Negative.   Psychiatric/Behavioral: Negative.      Today's Vitals   10/23/19 1128  BP: 136/82  Pulse: 84  Temp: 98.4 F (36.9 C)  TempSrc: Oral  Weight: 195 lb (88.5 kg)  Height: 5' 0.2" (1.529 m)   Body mass index is 37.83 kg/m.   Objective:  Physical Exam Constitutional:      Appearance: Normal appearance. She is well-developed.  HENT:     Head: Normocephalic and atraumatic.     Right Ear: Hearing normal.     Left Ear: Hearing normal.  Eyes:     General: Lids are normal.     Extraocular Movements: Extraocular movements intact.     Conjunctiva/sclera: Conjunctivae normal.     Pupils: Pupils are equal, round, and reactive to light.     Funduscopic exam:    Right eye: No papilledema.        Left eye: No papilledema.  Neck:     Thyroid: No thyroid mass.     Vascular: No carotid bruit.  Cardiovascular:     Rate and Rhythm: Normal rate and regular  rhythm.     Pulses: Normal pulses.     Heart sounds: Normal heart sounds. No murmur.  Pulmonary:     Effort: Pulmonary effort is normal.     Breath sounds: Normal breath sounds.  Abdominal:     General: Abdomen is flat. Bowel sounds are normal.     Palpations: Abdomen is soft.  Musculoskeletal:        General: No swelling. Normal range of motion.     Cervical back: Full passive range of motion without pain, normal range of motion and neck supple.     Right lower leg: No edema.     Left lower leg: No edema.  Skin:    General: Skin is warm and dry.     Capillary Refill: Capillary refill takes less than 2 seconds.  Neurological:     General: No focal deficit present.     Mental Status: She is alert and oriented to person, place, and time.     Cranial Nerves: No cranial nerve deficit.     Sensory: No sensory deficit.  Psychiatric:        Mood and Affect: Mood normal.        Behavior: Behavior normal.        Thought Content: Thought content normal.        Judgment: Judgment normal.         Assessment And Plan:     1. Encounter for annual physical exam . Behavior modifications discussed and diet history reviewed.   . Pt will continue to exercise regularly and modify diet with low GI, plant based foods and decrease intake of processed foods.  . Recommend intake of daily multivitamin, Vitamin D, and calcium.  . Recommend mammogram for preventive screenings, as well as recommend immunizations that include influenza, TDAP - POCT Urinalysis Dipstick (81002) - CMP14+EGFR - CBC without diff  2. Encounter for Depo-Provera contraception Negative urine pregnancy - POCT Urine Pregnancy - medroxyPROGESTERone (DEPO-PROVERA) injection 150 mg  3. Encounter for screening mammogram for breast cancer  Pt instructed on Self Breast Exam.According to ACOG guidelines Women aged 81 and older are recommended to get an annual mammogram. Form completed and given to patient contact the [facility] for appointment scheduing.   Pt encouraged to get annual mammogram - MM Digital Screening; Future  4. Excessive sweating  She is complaining of increase axilla sweat, advised to purchase drisol  Will check for metabolic causes - TSH  5. Mixed hyperlipidemia  Chronic, controlled  No current medications - Hemoglobin A1c - Lipid Profile  6. Obesity (BMI 30-39.9)  Chronic  Discussed healthy diet and regular exercise options   Encouraged to exercise at least 150 minutes per week with 2 days of strength training   Return in 2 months for weight  check.  Will start her on Saxenda, denies family history of thyroid cancer or history of pancreatitis. Discussed side effects of nausea  Goal to lose 1-2 lbs per week, increase water intake  7. History of COVID-19  Had covid in November with mild symptoms   Minette Brine, FNP    THE PATIENT IS ENCOURAGED TO PRACTICE SOCIAL DISTANCING DUE TO THE COVID-19 PANDEMIC.

## 2019-10-24 LAB — CMP14+EGFR
ALT: 17 IU/L (ref 0–32)
AST: 18 IU/L (ref 0–40)
Albumin/Globulin Ratio: 1.7 (ref 1.2–2.2)
Albumin: 4.1 g/dL (ref 3.8–4.8)
Alkaline Phosphatase: 106 IU/L (ref 39–117)
BUN/Creatinine Ratio: 14 (ref 9–23)
BUN: 11 mg/dL (ref 6–24)
Bilirubin Total: 0.4 mg/dL (ref 0.0–1.2)
CO2: 23 mmol/L (ref 20–29)
Calcium: 8.8 mg/dL (ref 8.7–10.2)
Chloride: 103 mmol/L (ref 96–106)
Creatinine, Ser: 0.79 mg/dL (ref 0.57–1.00)
GFR calc Af Amer: 105 mL/min/{1.73_m2} (ref >59)
GFR calc non Af Amer: 91 mL/min/{1.73_m2} (ref >59)
Globulin, Total: 2.4 g/dL (ref 1.5–4.5)
Glucose: 93 mg/dL (ref 65–99)
Potassium: 4.3 mmol/L (ref 3.5–5.2)
Sodium: 139 mmol/L (ref 134–144)
Total Protein: 6.5 g/dL (ref 6.0–8.5)

## 2019-10-24 LAB — CBC
Hematocrit: 37.1 % (ref 34.0–46.6)
Hemoglobin: 12.5 g/dL (ref 11.1–15.9)
MCH: 30.9 pg (ref 26.6–33.0)
MCHC: 33.7 g/dL (ref 31.5–35.7)
MCV: 92 fL (ref 79–97)
Platelets: 355 10*3/uL (ref 150–450)
RBC: 4.05 x10E6/uL (ref 3.77–5.28)
RDW: 12.6 % (ref 11.7–15.4)
WBC: 4.9 10*3/uL (ref 3.4–10.8)

## 2019-10-24 LAB — LIPID PANEL
Chol/HDL Ratio: 4 ratio (ref 0.0–4.4)
Cholesterol, Total: 195 mg/dL (ref 100–199)
HDL: 49 mg/dL (ref >39)
LDL Chol Calc (NIH): 131 mg/dL — ABNORMAL HIGH (ref 0–99)
Triglycerides: 81 mg/dL (ref 0–149)
VLDL Cholesterol Cal: 15 mg/dL (ref 5–40)

## 2019-10-24 LAB — TSH: TSH: 1.62 u[IU]/mL (ref 0.450–4.500)

## 2019-10-24 LAB — HEMOGLOBIN A1C
Est. average glucose Bld gHb Est-mCnc: 111 mg/dL
Hgb A1c MFr Bld: 5.5 % (ref 4.8–5.6)

## 2019-11-01 ENCOUNTER — Encounter: Payer: Self-pay | Admitting: Nurse Practitioner

## 2019-11-01 ENCOUNTER — Other Ambulatory Visit: Payer: Self-pay

## 2019-11-01 DIAGNOSIS — E669 Obesity, unspecified: Secondary | ICD-10-CM

## 2019-11-01 MED ORDER — SAXENDA 18 MG/3ML ~~LOC~~ SOPN: 3.0000 mL | PEN_INJECTOR | Freq: Every day | SUBCUTANEOUS | 3 refills | Status: DC

## 2019-11-09 NOTE — Addendum Note (Signed)
Addended by: Arnette Felts F on: 11/09/2019 09:44 AM   Modules accepted: Orders

## 2019-11-15 ENCOUNTER — Encounter: Payer: Self-pay | Admitting: Nurse Practitioner

## 2020-01-17 ENCOUNTER — Other Ambulatory Visit: Payer: Self-pay | Admitting: Nurse Practitioner

## 2020-01-17 DIAGNOSIS — Z3042 Encounter for surveillance of injectable contraceptive: Secondary | ICD-10-CM

## 2020-01-22 ENCOUNTER — Ambulatory Visit: Payer: 59 | Admitting: Nurse Practitioner

## 2020-01-28 ENCOUNTER — Encounter: Payer: Self-pay | Admitting: Nurse Practitioner

## 2020-01-28 ENCOUNTER — Other Ambulatory Visit: Payer: Self-pay

## 2020-01-28 ENCOUNTER — Ambulatory Visit (INDEPENDENT_AMBULATORY_CARE_PROVIDER_SITE_OTHER): Payer: 59 | Admitting: Nurse Practitioner

## 2020-01-28 VITALS — BP 124/80 | HR 94 | Temp 98.1°F | Ht 60.6 in | Wt 196.2 lb

## 2020-01-28 DIAGNOSIS — Z3202 Encounter for pregnancy test, result negative: Secondary | ICD-10-CM | POA: Diagnosis not present

## 2020-01-28 DIAGNOSIS — Z6837 Body mass index (BMI) 37.0-37.9, adult: Secondary | ICD-10-CM

## 2020-01-28 DIAGNOSIS — Z3042 Encounter for surveillance of injectable contraceptive: Secondary | ICD-10-CM | POA: Diagnosis not present

## 2020-01-28 DIAGNOSIS — E669 Obesity, unspecified: Secondary | ICD-10-CM | POA: Diagnosis not present

## 2020-01-28 LAB — POCT URINE PREGNANCY: Preg Test, Ur: NEGATIVE

## 2020-01-28 MED ORDER — MEDROXYPROGESTERONE ACETATE 150 MG/ML IM SUSP
150.0000 mg | Freq: Once | INTRAMUSCULAR | Status: AC
  Administered 2020-01-28: 150 mg via INTRAMUSCULAR

## 2020-01-28 MED ORDER — MEDROXYPROGESTERONE ACETATE 150 MG/ML IM SUSP: 150.0000 mg | INTRAMUSCULAR | 4 refills | Status: DC

## 2020-01-28 NOTE — Progress Notes (Signed)
This visit occurred during the SARS-CoV-2 public health emergency.  Safety protocols were in place, including screening questions prior to the visit, additional usage of staff PPE, and extensive cleaning of exam room while observing appropriate contact time as indicated for disinfecting solutions.  Subjective:     Patient ID: Janet Garrison , female    DOB: Oct 18, 1975 , 44 y.o.   MRN: [id number]   Chief Complaint  Patient presents with  . Weight Check  . Contraception    HPI  She is here today for weight management and depo injection.   She is planning to start water aerobics. She is taking Qsymia daily, she reports she had decreased to 188 lbs, does not eat past 8pm.  She had stopped taking due going to a weekend. - had 2 weddings in April. She was down to 188 lbs early part of April. She is not drinking an increased amount of water.  She just recently got a shipment of the Qsymia last week started back last.   Wt Readings from Last 3 Encounters: 01/28/20 : 196 lb 3.2 oz (89 kg) 10/23/19 : 195 lb (88.5 kg) 06/21/19 : 204 lb 12.8 oz (92.9 kg)      Past Medical History:  Diagnosis Date  . Medical history non-contributory      Family History  Problem Relation Age of Onset  . Hypertension Mother   . Cancer Father        bladder     Current Outpatient Medications:  .  medroxyPROGESTERone (DEPO-PROVERA) 150 MG/ML injection, INJECT 1 ML INTO MUSCLE EVERY 3 MONTHS, Disp: 1 mL, Rfl: 4 .  albuterol (VENTOLIN HFA) 108 (90 Base) MCG/ACT inhaler, Inhale 1-2 puffs into the lungs every 6 (six) hours as needed for wheezing or shortness of breath. (Patient not taking: Reported on 01/28/2020), Disp: 16 g, Rfl: 0 .  Liraglutide -Weight Management (SAXENDA) 18 MG/3ML SOPN, Inject 3 mLs into the skin daily. (Patient not taking: Reported on 01/28/2020), Disp: 3 pen, Rfl: 3   No Known Allergies   Review of Systems  Constitutional: Negative.   Respiratory: Negative.  Negative for cough.    Cardiovascular: Negative for chest pain, palpitations and leg swelling.  Neurological: Negative for dizziness and headaches.  Psychiatric/Behavioral: Negative.      Today's Vitals   01/28/20 1606  BP: 124/80  Pulse: 94  Temp: 98.1 F (36.7 C)  TempSrc: Oral  Weight: 196 lb 3.2 oz (89 kg)  Height: 5' 0.6" (1.539 m)  PainSc: 0-No pain   Body mass index is 37.56 kg/m.   Objective:  Physical Exam Constitutional:      General: She is not in acute distress.    Appearance: She is obese.  Cardiovascular:     Rate and Rhythm: Normal rate and regular rhythm.     Pulses: Normal pulses.     Heart sounds: Normal heart sounds. No murmur.  Pulmonary:     Effort: Pulmonary effort is normal. No respiratory distress.     Breath sounds: Normal breath sounds.  Skin:    Capillary Refill: Capillary refill takes less than 2 seconds.  Neurological:     General: No focal deficit present.     Mental Status: She is alert and oriented to person, place, and time.  Psychiatric:        Mood and Affect: Mood normal.        Behavior: Behavior normal.        Thought Content: Thought content normal.  Judgment: Judgment normal.         Assessment And Plan:     1. Obesity (BMI 30-39.9)  She had stopped taking her Qsymia for about 1 week  Encouraged her to not stop taking medications for weight loss  Also encouraged to increase physical activity and avoid eating high fat foods.  2. Encounter for Depo-Provera contraception  Negative pregnancy test - medroxyPROGESTERone (DEPO-PROVERA) injection 150 mg - POCT Urine Pregnancy - medroxyPROGESTERone (DEPO-PROVERA) 150 MG/ML injection; Inject 1 mL (150 mg total) into the muscle every 3 (three) months.  Dispense: 1 mL; Refill: 4      Minette Brine, FNP    THE PATIENT IS ENCOURAGED TO PRACTICE SOCIAL DISTANCING DUE TO THE COVID-19 PANDEMIC.

## 2020-01-28 NOTE — Patient Instructions (Signed)
   Calcium 1200 mg daily   Take vitamin d 3,000 mcg daily

## 2020-01-31 ENCOUNTER — Encounter: Payer: Self-pay | Admitting: Nurse Practitioner

## 2020-03-06 ENCOUNTER — Other Ambulatory Visit: Payer: Self-pay | Admitting: Nurse Practitioner

## 2020-03-06 DIAGNOSIS — E669 Obesity, unspecified: Secondary | ICD-10-CM

## 2020-03-06 MED ORDER — QSYMIA 11.25-69 MG PO CP24: 1.0000 | ORAL_CAPSULE | Freq: Every day | ORAL | 1 refills | Status: DC

## 2020-04-01 ENCOUNTER — Ambulatory Visit: Payer: 59 | Admitting: Nurse Practitioner

## 2020-05-06 ENCOUNTER — Ambulatory Visit: Payer: 59 | Admitting: Nurse Practitioner

## 2020-05-20 ENCOUNTER — Ambulatory Visit (INDEPENDENT_AMBULATORY_CARE_PROVIDER_SITE_OTHER): Payer: 59

## 2020-05-20 ENCOUNTER — Other Ambulatory Visit: Payer: Self-pay | Admitting: Podiatry

## 2020-05-20 ENCOUNTER — Ambulatory Visit (INDEPENDENT_AMBULATORY_CARE_PROVIDER_SITE_OTHER): Payer: 59 | Admitting: Podiatry

## 2020-05-20 ENCOUNTER — Other Ambulatory Visit: Payer: Self-pay

## 2020-05-20 DIAGNOSIS — M7662 Achilles tendinitis, left leg: Secondary | ICD-10-CM

## 2020-05-20 DIAGNOSIS — M722 Plantar fascial fibromatosis: Secondary | ICD-10-CM | POA: Diagnosis not present

## 2020-05-20 DIAGNOSIS — M216X9 Other acquired deformities of unspecified foot: Secondary | ICD-10-CM | POA: Diagnosis not present

## 2020-05-20 DIAGNOSIS — M779 Enthesopathy, unspecified: Secondary | ICD-10-CM

## 2020-05-20 NOTE — Progress Notes (Signed)
  Subjective:  Patient ID: Janet Garrison, female    DOB: 26-Apr-1976,  MRN: [id number]  Chief Complaint  Patient presents with  . Foot Pain    L foot, achilles and bottom of heel, lateral side of ankle. Pt stated, "I'm not sure if this is the same pain that I had before. Pain = 10/10 when I first get up in the morning, and when I stand after sitting. 5/10 when I walk at work. I've taken Tylenol and ibuprofen. I've used Biofreeze and ice. I tried stretching, but it felt like my tendon was going to tear, so I stopped. The doctor told me last time that I had a fracture at some point".    44 y.o. female presents with the above complaint. History confirmed with patient.   Objective:  Physical Exam: warm, good capillary refill, no trophic changes or ulcerative lesions, normal DP and PT pulses and normal sensory exam. Left Foot: tenderness to palpation medial calcaneal tuber, tenderness to palpation posterior calcaneus, no pain with calcaneal squeeze, decreased ankle joint ROM and +Silverskiold test  Radiographs: X-ray of the left foot: no evidence of calcaneal stress fracture, plantar calcaneal spur, posterior calcaneal spur and Haglund deformity noted  Assessment:   1. Plantar fasciitis   2. Achilles tendinitis of left lower extremity   3. Equinus deformity of foot      Plan:  Patient was evaluated and treated and all questions answered.  Plantar Fasciitis -XR reviewed with patient -Educated patient on stretching and icing of the affected limb -Night splint dispensed -Injection delivered to the plantar fascia of the left foot.  Procedure: Injection Tendon/Ligament Consent: Verbal consent obtained. Location: Left plantar fascia at the glabrous junction; medial approach. Skin Prep: Alcohol. Injectate: 1 cc 0.5% marcaine plain, 1 cc dexamethasone phosphate, 0.5 cc kenalog 10. Disposition: Patient tolerated procedure well. Injection site dressed with a band-aid.    No  follow-ups on file.

## 2020-05-20 NOTE — Patient Instructions (Addendum)

## 2020-06-13 ENCOUNTER — Ambulatory Visit (INDEPENDENT_AMBULATORY_CARE_PROVIDER_SITE_OTHER): Payer: 59 | Admitting: Podiatry

## 2020-06-13 ENCOUNTER — Other Ambulatory Visit: Payer: Self-pay

## 2020-06-13 DIAGNOSIS — M722 Plantar fascial fibromatosis: Secondary | ICD-10-CM | POA: Diagnosis not present

## 2020-06-13 DIAGNOSIS — M216X9 Other acquired deformities of unspecified foot: Secondary | ICD-10-CM

## 2020-06-13 DIAGNOSIS — M7662 Achilles tendinitis, left leg: Secondary | ICD-10-CM

## 2020-06-13 MED ORDER — METHYLPREDNISOLONE 4 MG PO TBPK
ORAL_TABLET | ORAL | 0 refills | Status: DC
Start: 2020-06-13 — End: 2021-10-13

## 2020-06-13 MED ORDER — MELOXICAM 15 MG PO TABS
15.0000 mg | ORAL_TABLET | Freq: Every day | ORAL | 0 refills | Status: DC
Start: 2020-06-13 — End: 2020-07-10

## 2020-06-13 NOTE — Progress Notes (Signed)
°  Subjective:  Patient ID: Marikay Roads, female    DOB: 1976/06/04,  MRN: [id number]  Chief Complaint  Patient presents with   Tendonitis    F/U LT tendonitis Pt states," it's the same, the boot helps at night buty it's hard to sleep with it. I';m on my feet more constantly all day; 9/*101 shapr pains.' - with swelling Tx: NS, and tylenol/aleve    44 y.o. female presents with the above complaint. History confirmed with patient.   Objective:  Physical Exam: warm, good capillary refill, no trophic changes or ulcerative lesions, normal DP and PT pulses and normal sensory exam. Left Foot: tenderness to palpation medial calcaneal tuber, tenderness to palpation posterior calcaneus, no pain with calcaneal squeeze, decreased ankle joint ROM and +Silverskiold test  Assessment:   1. Plantar fasciitis   2. Achilles tendinitis of left lower extremity   3. Equinus deformity of foot      Plan:  Patient was evaluated and treated and all questions answered.  Plantar Fasciitis -Rx for Medrol and Meloxicam. Advised to start Meloxicam the day after completion of oral steroid taper. Discussed r/b of medication -Continue night splint and PFB.  No follow-ups on file.

## 2020-07-10 ENCOUNTER — Other Ambulatory Visit: Payer: Self-pay | Admitting: Podiatry

## 2020-07-18 ENCOUNTER — Encounter: Payer: Self-pay | Admitting: Podiatry

## 2020-07-18 ENCOUNTER — Ambulatory Visit (INDEPENDENT_AMBULATORY_CARE_PROVIDER_SITE_OTHER): Payer: 59 | Admitting: Podiatry

## 2020-07-18 ENCOUNTER — Other Ambulatory Visit: Payer: Self-pay

## 2020-07-18 DIAGNOSIS — M7662 Achilles tendinitis, left leg: Secondary | ICD-10-CM | POA: Diagnosis not present

## 2020-07-18 DIAGNOSIS — M722 Plantar fascial fibromatosis: Secondary | ICD-10-CM | POA: Diagnosis not present

## 2020-07-18 DIAGNOSIS — M216X9 Other acquired deformities of unspecified foot: Secondary | ICD-10-CM

## 2020-07-18 NOTE — Progress Notes (Signed)
  Subjective:  Patient ID: Janet Garrison, female    DOB: 13-Feb-1976,  MRN: [id number]  No chief complaint on file.   44 y.o. female presents for f/u. States the prednisone pack did not help at all, pain is unchanged, possibly worse. Work is a significant contributing factor for her pain per patient.  Objective:  Physical Exam: warm, good capillary refill, no trophic changes or ulcerative lesions, normal DP and PT pulses and normal sensory exam. Left Foot: tenderness to palpation medial calcaneal tuber, tenderness to palpation posterior calcaneus, no pain with calcaneal squeeze, decreased ankle joint ROM and +Silverskiold test. More tenderness at the Achilles than heel.  Assessment:   1. Achilles tendinitis of left lower extremity   2. Plantar fasciitis   3. Equinus deformity of foot    Plan:  Patient was evaluated and treated and all questions answered.  Plantar Fasciitis -Immobilize in CAM boot -D/c Meloxicam. Start voltaren gel. -Should pain persist will consider discussion of surgical options.  Return in about 4 weeks (around 08/15/2020) for Plantar fasciitis, Tendonitis, Left.

## 2020-08-15 ENCOUNTER — Ambulatory Visit: Payer: 59 | Admitting: Podiatry

## 2020-08-29 ENCOUNTER — Other Ambulatory Visit: Payer: Self-pay

## 2020-08-29 ENCOUNTER — Ambulatory Visit (INDEPENDENT_AMBULATORY_CARE_PROVIDER_SITE_OTHER): Payer: 59 | Admitting: Podiatry

## 2020-08-29 DIAGNOSIS — E669 Obesity, unspecified: Secondary | ICD-10-CM | POA: Diagnosis not present

## 2020-08-29 DIAGNOSIS — M7662 Achilles tendinitis, left leg: Secondary | ICD-10-CM

## 2020-08-29 DIAGNOSIS — M216X9 Other acquired deformities of unspecified foot: Secondary | ICD-10-CM

## 2020-08-29 DIAGNOSIS — M722 Plantar fascial fibromatosis: Secondary | ICD-10-CM | POA: Diagnosis not present

## 2020-09-11 NOTE — Progress Notes (Signed)
°  Subjective:  Patient ID: Janet Garrison, female    DOB: Feb 15, 1976,  MRN: [id number]  Chief Complaint  Patient presents with   Tendonitis    Pt states still occasional sharp pains even at rest, although she has had some mild improvement.    44 y.o. female presents for f/u. Would like to consider next step  Objective:  Physical Exam: warm, good capillary refill, no trophic changes or ulcerative lesions, normal DP and PT pulses and normal sensory exam. Left Foot: tenderness to palpation medial calcaneal tuber, tenderness to palpation posterior calcaneus, no pain with calcaneal squeeze, decreased ankle joint ROM and +Silverskiold test. More tenderness at the Achilles than heel.  Assessment:   1. Achilles tendinitis of left lower extremity   2. Plantar fasciitis   3. Equinus deformity of foot    Plan:  Patient was evaluated and treated and all questions answered.  Plantar Fasciitis -Patient has failed all conservative therapy and wishes to proceed with surgical intervention. All risks, benefits, and alternatives discussed with patient. No guarantees given. Consent reviewed and signed by patient. -Planned procedures: Endoscopic plantar fasciotomy left -Identified risk factors: obesity    No follow-ups on file.

## 2020-09-15 ENCOUNTER — Telehealth: Payer: Self-pay | Admitting: Podiatry

## 2020-09-15 NOTE — Telephone Encounter (Signed)
DOS: 10/08/2020  Procedure: Endoscopic Plantar Fasciotomy Lt (18403)  [employer] Effective From 07/11/2020 - 07/10/2021  Deductible: $0 for individual Out of Pocket: $6,000 with $168.53 met and $6,431.47 remaining. CoInsurance: 40% Copay: $0

## 2020-09-22 ENCOUNTER — Telehealth: Payer: Self-pay

## 2020-09-22 NOTE — Telephone Encounter (Signed)
DOS 10/08/2020  EPF LT - 59470  [employer] EFFECTIVE DATE - 07/11/2020  PLAN DEDUCTIBLE - $0.00 OUT OF POCKET - $6600.00 W/ $7615.18 REMAINING COPAY $0.00 COINSURANCE - 40%  CASE DETAILS NOTIFICATION/PRIOR AUTHORIZATION NUMBER CASE STATUS CASE STATUS REASON PRIMARY CARE PHYSICIAN [id number] Closed Case Was Managed And Is Now Complete - ADVANCE NOTIFY DATE/TIME ADMISSION NOTIFY DATE/TIME 09/19/2020 02:36 PM CST - COVERAGE STATUS OVERALL COVERAGE STATUS Covered/Approved 1-1 CODE DESCRIPTION COVERAGE STATUS DECISION DATE [facility] Coverage determination is reflected for the facility admission and is not a guarantee of payment for ongoing services. Covered/Approved 09/22/2020 1 [id number] Endoscopic plantar fasciotomy Covered/Approved 09/22/2020

## 2020-10-08 ENCOUNTER — Other Ambulatory Visit: Payer: Self-pay | Admitting: Podiatry

## 2020-10-08 DIAGNOSIS — M722 Plantar fascial fibromatosis: Secondary | ICD-10-CM

## 2020-10-08 MED ORDER — CEPHALEXIN 500 MG PO CAPS: 500.0000 mg | ORAL_CAPSULE | Freq: Two times a day (BID) | ORAL | 0 refills | Status: DC

## 2020-10-08 MED ORDER — ONDANSETRON HCL 4 MG PO TABS: 4.0000 mg | ORAL_TABLET | Freq: Three times a day (TID) | ORAL | 0 refills | Status: DC | PRN

## 2020-10-08 MED ORDER — OXYCODONE-ACETAMINOPHEN 5-325 MG PO TABS: 1.0000 | ORAL_TABLET | ORAL | 0 refills | Status: DC | PRN

## 2020-10-08 NOTE — Progress Notes (Signed)
Rx sent to pharmacy for outpatient surgery. °

## 2020-10-08 NOTE — Addendum Note (Signed)
Addended by: Ventura Sellers on: 10/08/2020 08:14 AM   Modules accepted: Orders

## 2020-10-14 ENCOUNTER — Ambulatory Visit (INDEPENDENT_AMBULATORY_CARE_PROVIDER_SITE_OTHER): Payer: 59 | Admitting: Podiatry

## 2020-10-14 ENCOUNTER — Other Ambulatory Visit: Payer: Self-pay

## 2020-10-14 DIAGNOSIS — M7662 Achilles tendinitis, left leg: Secondary | ICD-10-CM

## 2020-10-14 DIAGNOSIS — M722 Plantar fascial fibromatosis: Secondary | ICD-10-CM

## 2020-10-14 NOTE — Progress Notes (Signed)
  Subjective:  Patient ID: Janet Garrison, female    DOB: 03/19/1976,  MRN: [id number]  Chief Complaint  Patient presents with  . Routine Post Op    POV#1 DOS 12.29.2021 EPF LT. Pt states no concerns, denies fever/chills/nausea/vomiting. Pt states oxycodone is not helping however she is taking Tylenol PM which is helpful.   DOS: 10/08/20 Procedure: EPF left  45 y.o. female presents with the above complaint. History confirmed with patient.   Objective:  Physical Exam: tenderness at the surgical site, local edema noted and calf supple, nontender. Incision: healing well, no significant drainage, no dehiscence  Assessment:   1. Achilles tendinitis of left lower extremity   2. Plantar fasciitis     Plan:  Patient was evaluated and treated and all questions answered.  Post-operative State -Ok to start showering at this time. Advised they cannot soak. -Dressing applied consisting of band-aid -WBAT in CAM boot  No follow-ups on file.

## 2020-10-16 DIAGNOSIS — M79676 Pain in unspecified toe(s): Secondary | ICD-10-CM

## 2020-10-21 ENCOUNTER — Other Ambulatory Visit: Payer: Self-pay

## 2020-10-21 ENCOUNTER — Ambulatory Visit (INDEPENDENT_AMBULATORY_CARE_PROVIDER_SITE_OTHER): Payer: 59 | Admitting: Podiatry

## 2020-10-21 DIAGNOSIS — M722 Plantar fascial fibromatosis: Secondary | ICD-10-CM | POA: Diagnosis not present

## 2020-10-21 DIAGNOSIS — M7662 Achilles tendinitis, left leg: Secondary | ICD-10-CM

## 2020-10-21 DIAGNOSIS — Z9889 Other specified postprocedural states: Secondary | ICD-10-CM | POA: Diagnosis not present

## 2020-10-21 MED ORDER — OXYCODONE-ACETAMINOPHEN 5-325 MG PO TABS: 1.0000 | ORAL_TABLET | ORAL | 0 refills | Status: DC | PRN

## 2020-10-21 NOTE — Progress Notes (Signed)
  Subjective:  Patient ID: Janet Garrison, female    DOB: 11-13-1975,  MRN: [id number]  Chief Complaint  Patient presents with  . Routine Post Op    POV#2 Pt states she accidentally put some weight on her foot 3 days ago.   DOS: 10/08/20 Procedure: EPF left  45 y.o. female presents with the above complaint. History confirmed with patient. States she put weight on the foot Sunday and had pain right afterwards but otherwise new issues.  Objective:  Physical Exam: tenderness at the surgical site, local edema noted and calf supple, nontender. Incision: healing well, no significant drainage, no dehiscence  Assessment:   1. Achilles tendinitis of left lower extremity   2. Post-operative state   3. Plantar fasciitis     Plan:  Patient was evaluated and treated and all questions answered.  Post-operative State -Sutures removed. Steris applied. OK to continue showering. -Ok to transition to surgical shoe today with heel pad. Remove heel pad in 1-2 weeks when comfortable and trial normal shoegear. -Refill pain medication  Return in about 3 weeks (around 11/11/2020) for Post-Op (No XRs).

## 2020-10-28 ENCOUNTER — Telehealth: Payer: Self-pay

## 2020-10-28 NOTE — Telephone Encounter (Signed)
LVM telling pt her appt was rescheduled due to the weather her provider will not be in the office, also made pt aware she can call the office to reschedule the appt that was made for her if its not convenient

## 2020-10-29 ENCOUNTER — Encounter: Payer: 59 | Admitting: Nurse Practitioner

## 2020-11-04 ENCOUNTER — Encounter: Payer: 59 | Admitting: Podiatry

## 2020-11-11 ENCOUNTER — Ambulatory Visit (INDEPENDENT_AMBULATORY_CARE_PROVIDER_SITE_OTHER): Payer: 59 | Admitting: Podiatry

## 2020-11-11 ENCOUNTER — Other Ambulatory Visit: Payer: Self-pay

## 2020-11-11 ENCOUNTER — Encounter: Payer: Self-pay | Admitting: Podiatry

## 2020-11-11 DIAGNOSIS — M722 Plantar fascial fibromatosis: Secondary | ICD-10-CM

## 2020-11-11 DIAGNOSIS — Z9889 Other specified postprocedural states: Secondary | ICD-10-CM

## 2020-11-11 NOTE — Progress Notes (Signed)
  Subjective:  Patient ID: Janet Garrison, female    DOB: 07-Sep-1976,  MRN: [id number]  Chief Complaint  Patient presents with  . Routine Post Op    Post-Op (No XRs).POV #4 DOS 10/08/2020 EPF LT. Pt. Complains that skin near surgical area has started to peel.    DOS: 10/08/20 Procedure: EPF left  45 y.o. female presents with the above complaint. History confirmed with patient. States her heel is doing very well, does not have pain, is ready to go back to work.  Objective:  Physical Exam: tenderness at the surgical site, local edema noted and calf supple, nontender. Incision: healing well, no significant drainage, no dehiscence  Assessment:   1. Post-operative state   2. Plantar fasciitis    Plan:  Patient was evaluated and treated and all questions answered.  Post-operative State -Doing very well post-op. Denies pain or discomfort. Ready for normal shoegear -Ok to return to work -F/u in 6 weeks for recheck.  Return in about 6 weeks (around 12/23/2020).

## 2020-11-19 ENCOUNTER — Encounter: Payer: 59 | Admitting: Nurse Practitioner

## 2021-05-12 ENCOUNTER — Ambulatory Visit: Payer: 59 | Admitting: Podiatry

## 2021-05-12 ENCOUNTER — Other Ambulatory Visit: Payer: Self-pay

## 2021-05-12 ENCOUNTER — Ambulatory Visit (INDEPENDENT_AMBULATORY_CARE_PROVIDER_SITE_OTHER): Payer: Medicaid Other | Admitting: Podiatry

## 2021-05-12 DIAGNOSIS — M722 Plantar fascial fibromatosis: Secondary | ICD-10-CM

## 2021-05-12 NOTE — Progress Notes (Signed)
  Subjective:  Patient ID: Janet Garrison, female    DOB: 11/20/1975,  MRN: [id number]  Chief Complaint  Patient presents with   Routine Post Op    POV #5 EPF of left foot. Pt states she is doing well no more pain in arch when waking in the am but she still has minor pains in lateral side of left heel occasionally.    DOS: 10/08/20 Procedure: EPF left  45 y.o. female presents with the above complaint. History confirmed with patient.  Doing very well tolerating normal shoegear without issue.  Objective:  Physical Exam: no tenderness at the surgical site and no edema noted. Incision: well healed thin scarring  Assessment:   1. Plantar fasciitis     Plan:  Patient was evaluated and treated and all questions answered.  Post-operative State -Doing well I think most of her pain is related to shoe gear. Discussed flats are not best for her and supportive shoegear is preferable.   No follow-ups on file.

## 2021-10-13 ENCOUNTER — Encounter: Payer: Self-pay | Admitting: Nurse Practitioner

## 2021-10-13 ENCOUNTER — Other Ambulatory Visit (HOSPITAL_COMMUNITY)
Admission: RE | Admit: 2021-10-13 | Discharge: 2021-10-13 | Disposition: A | Discharge status: Home / Self Care | Payer: 59 | Source: Ambulatory Visit | Attending: Nurse Practitioner | Admitting: Nurse Practitioner

## 2021-10-13 ENCOUNTER — Ambulatory Visit (INDEPENDENT_AMBULATORY_CARE_PROVIDER_SITE_OTHER): Payer: 59 | Admitting: Nurse Practitioner

## 2021-10-13 ENCOUNTER — Other Ambulatory Visit: Payer: Self-pay

## 2021-10-13 VITALS — BP 118/80 | HR 91 | Temp 98.2°F | Ht 60.2 in | Wt 207.4 lb

## 2021-10-13 DIAGNOSIS — Z1231 Encounter for screening mammogram for malignant neoplasm of breast: Secondary | ICD-10-CM

## 2021-10-13 DIAGNOSIS — Z0000 Encounter for general adult medical examination without abnormal findings: Secondary | ICD-10-CM | POA: Diagnosis not present

## 2021-10-13 DIAGNOSIS — Z6841 Body Mass Index (BMI) 40.0 and over, adult: Secondary | ICD-10-CM

## 2021-10-13 DIAGNOSIS — Z1211 Encounter for screening for malignant neoplasm of colon: Secondary | ICD-10-CM

## 2021-10-13 DIAGNOSIS — Z124 Encounter for screening for malignant neoplasm of cervix: Secondary | ICD-10-CM | POA: Insufficient documentation

## 2021-10-13 DIAGNOSIS — Z1159 Encounter for screening for other viral diseases: Secondary | ICD-10-CM

## 2021-10-13 DIAGNOSIS — E782 Mixed hyperlipidemia: Secondary | ICD-10-CM | POA: Diagnosis not present

## 2021-10-13 DIAGNOSIS — Z87440 Personal history of urinary (tract) infections: Secondary | ICD-10-CM

## 2021-10-13 DIAGNOSIS — Z79899 Other long term (current) drug therapy: Secondary | ICD-10-CM

## 2021-10-13 DIAGNOSIS — Z2821 Immunization not carried out because of patient refusal: Secondary | ICD-10-CM

## 2021-10-13 LAB — POCT URINALYSIS DIPSTICK
Bilirubin, UA: NEGATIVE
Blood, UA: NEGATIVE
Glucose, UA: NEGATIVE
Ketones, UA: NEGATIVE
Leukocytes, UA: NEGATIVE
Nitrite, UA: NEGATIVE
Protein, UA: NEGATIVE
Spec Grav, UA: 1.02 (ref 1.010–1.025)
Urobilinogen, UA: 0.2 E.U./dL
pH, UA: 7.5 (ref 5.0–8.0)

## 2021-10-13 NOTE — Progress Notes (Signed)
I,Janet Garrison,acting as a Education administrator for [employer], FNP.,have documented all relevant documentation on the behalf of Minette Brine, FNP,as directed by  Minette Brine, FNP while in the presence of Minette Brine, [place].   This visit occurred during the SARS-CoV-2 public health emergency.  Safety protocols were in place, including screening questions prior to the visit, additional usage of staff PPE, and extensive cleaning of exam room while observing appropriate contact time as indicated for disinfecting solutions.  Subjective:     Patient ID: Janet Garrison , female    DOB: 1976-05-29 , 46 y.o.   MRN: [id number]   Chief Complaint  Patient presents with   Annual Exam    HPI  Here for HM. She is working with [employer] now. Has her PAPs done here at the office  Wt Readings from Last 3 Encounters: 10/13/21 : 207 lb 6.4 oz (94.1 kg) 01/28/20 : 196 lb 3.2 oz (89 kg) 10/23/19 : 195 lb (88.5 kg)       Past Medical History:  Diagnosis Date   Medical history non-contributory      Family History  Problem Relation Age of Onset   Hypertension Mother    Cancer Father        bladder    No current outpatient medications on file.   No Known Allergies    The patient states she uses none for birth control.  Patient's last menstrual period was 10/07/2021.. Negative for Dysmenorrhea and Negative for Menorrhagia. Negative for: breast discharge, breast lump(s), breast pain and breast self exam. Associated symptoms include abnormal vaginal bleeding. Pertinent negatives include abnormal bleeding (hematology), anxiety, decreased libido, depression, difficulty falling sleep, dyspareunia, history of infertility, nocturia, sexual dysfunction, sleep disturbances, urinary incontinence, urinary urgency, vaginal discharge and vaginal itching. Diet regular. The patient states her exercise level is none.   The patient's tobacco use is:  Social History   Tobacco Use  Smoking Status Never   Passive  exposure: Yes  Smokeless Tobacco Never   She has been exposed to passive smoke. The patient's alcohol use is:  Social History   Substance and Sexual Activity  Alcohol Use Yes   Comment: occasional   Additional information: Last pap 10/13/2018, next one scheduled for 10/13/2021.    Review of Systems  Constitutional: Negative.   HENT: Negative.    Eyes: Negative.   Respiratory: Negative.    Cardiovascular: Negative.   Gastrointestinal: Negative.   Endocrine: Negative.   Genitourinary: Negative.   Musculoskeletal: Negative.   Skin: Negative.   Allergic/Immunologic: Negative.   Neurological: Negative.   Hematological: Negative.   Psychiatric/Behavioral: Negative.      Today's Vitals   10/13/21 1106  BP: 118/80  Pulse: 91  Temp: 98.2 F (36.8 C)  Weight: 207 lb 6.4 oz (94.1 kg)  Height: 5' 0.2" (1.529 m)  PainSc: 0-No pain   Body mass index is 40.24 kg/m.   Objective:  Physical Exam Constitutional:      Appearance: Normal appearance. She is well-developed.  HENT:     Head: Normocephalic and atraumatic.     Right Ear: Hearing, tympanic membrane, ear canal and external ear normal. There is no impacted cerumen.     Left Ear: Hearing, tympanic membrane, ear canal and external ear normal. There is no impacted cerumen.     Nose:     Comments: Deferred - masked    Mouth/Throat:     Comments: Deferred - masked Eyes:     General: Lids are normal.  Extraocular Movements: Extraocular movements intact.     Conjunctiva/sclera: Conjunctivae normal.     Pupils: Pupils are equal, round, and reactive to light.     Funduscopic exam:    Right eye: No papilledema.        Left eye: No papilledema.  Neck:     Thyroid: No thyroid mass.     Vascular: No carotid bruit.  Cardiovascular:     Rate and Rhythm: Normal rate and regular rhythm.     Pulses: Normal pulses.     Heart sounds: Normal heart sounds. No murmur heard. Pulmonary:     Effort: Pulmonary effort is normal. No  respiratory distress.     Breath sounds: Normal breath sounds. No wheezing.  Abdominal:     General: Abdomen is flat. Bowel sounds are normal. There is no distension.     Palpations: Abdomen is soft.     Tenderness: There is no abdominal tenderness.     Comments: Rounded abdomen  Musculoskeletal:        General: No swelling. Normal range of motion.     Cervical back: Full passive range of motion without pain, normal range of motion and neck supple.     Right lower leg: No edema.     Left lower leg: No edema.  Skin:    General: Skin is warm and dry.     Capillary Refill: Capillary refill takes less than 2 seconds.  Neurological:     General: No focal deficit present.     Mental Status: She is alert and oriented to person, place, and time.     Cranial Nerves: No cranial nerve deficit.     Sensory: No sensory deficit.  Psychiatric:        Mood and Affect: Mood normal.        Behavior: Behavior normal.        Thought Content: Thought content normal.        Judgment: Judgment normal.        Assessment And Plan:     1. Encounter for general adult medical examination w/o abnormal findings Behavior modifications discussed and diet history reviewed.   Pt will continue to exercise regularly and modify diet with low GI, plant based foods and decrease intake of processed foods.  Recommend intake of daily multivitamin, Vitamin D, and calcium.  Recommend mammogram and colonoscopy for preventive screenings, as well as recommend immunizations that include influenza, TDAP  2. Mixed hyperlipidemia Comments: Cholesterol levels were slightly elevated at last visit and currently managing with diet control - CMP14+EGFR - Lipid panel  3. Influenza vaccination declined Patient declined influenza vaccination at this time. Patient is aware that influenza vaccine prevents illness in 70% of healthy people, and reduces hospitalizations to 30-70% in elderly. This vaccine is recommended annually. Pt is  willing to accept risk associated with refusing vaccination.  4. Class 3 severe obesity due to excess calories with body mass index (BMI) of 40.0 to 44.9 in adult, unspecified whether serious comorbidity present (HCC) Chronic Discussed healthy diet and regular exercise options  Encouraged to exercise at least 150 minutes per week with 2 days of strength training. Encouraged to park further when at the store, take stairs instead of elevators and to walk in place during commercials. Increase water intake to at least one gallon of water daily.  5. Screening for colon cancer According to USPTF Colorectal cancer Screening guidelines. Colonoscopy is recommended every 10 years, starting at age 23years. Will refer to GI for  colon cancer screening. - Ambulatory referral to Gastroenterology  6. Encounter for hepatitis C screening test for low risk patient Will check Hepatitis C screening due to recent recommendations to screen all adults 18 years and older - Hepatitis C antibody  7. Encounter for Papanicolaou smear of cervix No abnormal findings seen on physical exam - Cytology -Pap Smear  8. Other long term (current) drug therapy - CBC  9. Encounter for screening mammogram for breast cancer Pt instructed on Self Breast Exam.According to ACOG guidelines Women aged 77 and older are recommended to get an annual mammogram. Referral placed to [place] for appointment scheduling.  - MM DIGITAL SCREENING BILATERAL; Future  10. History of UTI Urinalysis is normal - POCT Urinalysis Dipstick (78412)     Patient was given opportunity to ask questions. Patient verbalized understanding of the plan and was able to repeat key elements of the plan. All questions were answered to their satisfaction.   Minette Brine, FNP   I, Minette Brine, FNP, have reviewed all documentation for this visit. The documentation on 10/13/21 for the exam, diagnosis, procedures, and orders are all accurate and  complete.   THE PATIENT IS ENCOURAGED TO PRACTICE SOCIAL DISTANCING DUE TO THE COVID-19 PANDEMIC.

## 2021-10-13 NOTE — Patient Instructions (Signed)

## 2021-10-16 LAB — CYTOLOGY - PAP
Adequacy: ABSENT
Diagnosis: NEGATIVE

## 2021-10-20 ENCOUNTER — Ambulatory Visit: Payer: 59 | Admitting: Nurse Practitioner

## 2021-11-23 ENCOUNTER — Ambulatory Visit: Payer: 59

## 2022-04-06 ENCOUNTER — Encounter: Payer: Self-pay | Admitting: Nurse Practitioner

## 2022-04-06 ENCOUNTER — Ambulatory Visit (INDEPENDENT_AMBULATORY_CARE_PROVIDER_SITE_OTHER): Payer: 59 | Admitting: Nurse Practitioner

## 2022-04-06 VITALS — BP 122/80 | HR 95 | Temp 98.1°F | Ht 60.0 in | Wt 210.8 lb

## 2022-04-06 DIAGNOSIS — R300 Dysuria: Secondary | ICD-10-CM

## 2022-04-06 DIAGNOSIS — R82998 Other abnormal findings in urine: Secondary | ICD-10-CM | POA: Diagnosis not present

## 2022-04-06 DIAGNOSIS — Z6841 Body Mass Index (BMI) 40.0 and over, adult: Secondary | ICD-10-CM | POA: Diagnosis not present

## 2022-04-06 LAB — POCT URINALYSIS DIPSTICK
Bilirubin, UA: NEGATIVE
Glucose, UA: NEGATIVE
Ketones, UA: NEGATIVE
Nitrite, UA: NEGATIVE
Protein, UA: NEGATIVE
Spec Grav, UA: 1.02 (ref 1.010–1.025)
Urobilinogen, UA: 0.2 E.U./dL
pH, UA: 7 (ref 5.0–8.0)

## 2022-04-06 MED ORDER — SAXENDA 18 MG/3ML ~~LOC~~ SOPN: 3.0000 mg | PEN_INJECTOR | Freq: Every day | SUBCUTANEOUS | 1 refills | Status: DC

## 2022-04-06 MED ORDER — NITROFURANTOIN MONOHYD MACRO 100 MG PO CAPS: 100.0000 mg | ORAL_CAPSULE | Freq: Two times a day (BID) | ORAL | 0 refills | Status: AC

## 2022-04-10 LAB — URINE CULTURE

## 2022-04-12 ENCOUNTER — Encounter: Payer: Self-pay | Admitting: Nurse Practitioner

## 2022-06-02 ENCOUNTER — Ambulatory Visit: Payer: 59 | Admitting: Nurse Practitioner

## 2022-09-13 ENCOUNTER — Ambulatory Visit (INDEPENDENT_AMBULATORY_CARE_PROVIDER_SITE_OTHER): Payer: 59 | Admitting: Podiatry

## 2022-09-13 ENCOUNTER — Encounter: Payer: Self-pay | Admitting: Podiatry

## 2022-09-13 ENCOUNTER — Ambulatory Visit (INDEPENDENT_AMBULATORY_CARE_PROVIDER_SITE_OTHER): Payer: 59

## 2022-09-13 DIAGNOSIS — M7752 Other enthesopathy of left foot: Secondary | ICD-10-CM | POA: Diagnosis not present

## 2022-09-13 DIAGNOSIS — M7662 Achilles tendinitis, left leg: Secondary | ICD-10-CM

## 2022-09-13 MED ORDER — TRIAMCINOLONE ACETONIDE 10 MG/ML IJ SUSP
10.0000 mg | Freq: Once | INTRAMUSCULAR | Status: AC
  Administered 2022-09-13: 10 mg

## 2022-09-13 NOTE — Progress Notes (Signed)
Subjective:   Patient ID: Janet Garrison, female   DOB: 46 y.o.   MRN: [id number]   HPI Patient presents stating she has had pain in her left ankle and states that her plantar fascia is doing better after having had surgery about 2 years ago   ROS      Objective:  Physical Exam  Neurovascular status intact inflammation of the left sinus tarsi and some swelling around the peroneal tendon but it does not appear the pain is in that area     Assessment:  Probability for chronic sinus tarsitis left cannot rule out pathology of the peroneal tendon     Plan:  Went ahead today and I will get a focus on the sinus tarsi I did standard prep injected the sinus tarsi 3 mg Dexasone Kenalog 5 mg Xylocaine discussed stretching and reappoint to recheck 2 weeks  X-rays were negative for signs of what appears to be arthritis appears to be soft tissue plantar spur noted

## 2022-09-27 ENCOUNTER — Ambulatory Visit (INDEPENDENT_AMBULATORY_CARE_PROVIDER_SITE_OTHER): Payer: 59 | Admitting: Podiatry

## 2022-09-27 DIAGNOSIS — M7672 Peroneal tendinitis, left leg: Secondary | ICD-10-CM

## 2022-09-27 DIAGNOSIS — T148XXA Other injury of unspecified body region, initial encounter: Secondary | ICD-10-CM | POA: Diagnosis not present

## 2022-09-27 NOTE — Progress Notes (Signed)
Subjective:   Patient ID: Janet Garrison, female   DOB: 46 y.o.   MRN: [id number]   HPI Patient presents stating that the outside of her left ankle remains very sore and it has been about 9 months now and it improved some with the injection but still tender   ROS      Objective:  Physical Exam  Neurovascular status intact exquisite discomfort now centered mostly around the peroneal tendon left with swelling of the tendon as it comes underneath and behind the lateral malleolus     Assessment:  Due to continued pain and swelling possibility for a interstitial tear of the peroneal tendon cannot be discounted     Plan:  Reviewed with patient explained condition and at this point recommended MRI to rule out interstitial tear of the tendon.  Patient will be scheduled for this will get the results and decide what to do long-term

## 2022-10-24 ENCOUNTER — Ambulatory Visit
Admission: RE | Admit: 2022-10-24 | Discharge: 2022-10-24 | Disposition: A | Discharge status: Home / Self Care | Payer: 59 | Source: Ambulatory Visit | Attending: Podiatry | Admitting: Podiatry

## 2022-10-24 DIAGNOSIS — T148XXA Other injury of unspecified body region, initial encounter: Secondary | ICD-10-CM

## 2022-11-18 ENCOUNTER — Ambulatory Visit (INDEPENDENT_AMBULATORY_CARE_PROVIDER_SITE_OTHER): Payer: 59 | Admitting: Podiatry

## 2022-11-18 ENCOUNTER — Encounter: Payer: Self-pay | Admitting: Podiatry

## 2022-11-18 DIAGNOSIS — M7672 Peroneal tendinitis, left leg: Secondary | ICD-10-CM

## 2022-11-18 DIAGNOSIS — M7662 Achilles tendinitis, left leg: Secondary | ICD-10-CM

## 2022-11-18 MED ORDER — TRIAMCINOLONE ACETONIDE 10 MG/ML IJ SUSP
10.0000 mg | Freq: Once | INTRAMUSCULAR | Status: AC
  Administered 2022-11-18: 10 mg

## 2022-11-19 NOTE — Progress Notes (Signed)
Subjective:   Patient ID: Janet Garrison, female   DOB: 47 y.o.   MRN: [id number]   HPI Patient presents stating the pain seems to have settled more into the Achilles tendon now and slightly lateral but it has been bothersome for many and I would like to try to get it treated   ROS      Objective:  Physical Exam  Neurovascular status intact inflammation pain of the Achilles tendon left lateral side mild into the peroneal tendon with inflammation fluid at the insertion     Assessment:  Achilles tendinitis chronic peroneal tendinitis with MRI indicating no signs of rupture but did indicate some thickness of the Achilles insertion     Plan:  H&P reviewed condition recommended aggressive conservative approach to try to get this better explained ultimately may need surgery I discussed injection today and risk of rupture associated with it.  She wants the procedure sterile prep injected the Achilles left lateral side 3 mg Dexasone Kenalog 5 mg Xylocaine and applied air fracture walker to completely immobilize and will wear full-time for 3 to 4 weeks reappoint 5 weeks

## 2022-11-23 ENCOUNTER — Telehealth: Payer: Self-pay | Admitting: *Deleted

## 2022-11-23 NOTE — Telephone Encounter (Signed)
Patient is calling because she thought an anti inflammatory medicine was supposed to sent to pharmacy after visit. Please advise.

## 2022-11-24 ENCOUNTER — Other Ambulatory Visit: Payer: Self-pay | Admitting: Podiatry

## 2022-11-24 MED ORDER — DICLOFENAC SODIUM 75 MG PO TBEC: 75.0000 mg | DELAYED_RELEASE_TABLET | Freq: Two times a day (BID) | ORAL | 2 refills | Status: DC

## 2022-11-24 NOTE — Telephone Encounter (Signed)
Sent it over to her

## 2022-12-23 ENCOUNTER — Encounter: Payer: Self-pay | Admitting: Podiatry

## 2022-12-23 ENCOUNTER — Ambulatory Visit (INDEPENDENT_AMBULATORY_CARE_PROVIDER_SITE_OTHER): Payer: 59 | Admitting: Podiatry

## 2022-12-23 DIAGNOSIS — M7662 Achilles tendinitis, left leg: Secondary | ICD-10-CM | POA: Diagnosis not present

## 2022-12-23 NOTE — Progress Notes (Signed)
Subjective:   Patient ID: Janet Garrison, female   DOB: 47 y.o.   MRN: [id number]   HPI Patient states she is improved now but she has been wearing the boot not sure how it is going to be when she is no longer wearing the boot   ROS      Objective:  Physical Exam  Neurovascular status intact posterior lateral pain Achilles tendon that has improved with only mild discomfort now upon palpation no swelling no erythema or edema     Assessment:  Overall appears to be doing well with Achilles tendinitis left with immobilization     Plan:  Reviewed condition recommended gradual reduction immobilization ice therapy topical anti-inflammatories and stretch.  If symptoms were to persist we need to consider other treatment plans including PRP or shockwave and I explained that to the patient.  Patient was dispensed heel lifts that I want her to use at this time

## 2023-01-11 ENCOUNTER — Encounter: Payer: 59 | Admitting: Nurse Practitioner

## 2023-01-19 ENCOUNTER — Ambulatory Visit: Payer: 59 | Admitting: Podiatry

## 2023-07-02 ENCOUNTER — Other Ambulatory Visit: Payer: Self-pay | Admitting: Podiatry

## 2023-08-10 ENCOUNTER — Ambulatory Visit (INDEPENDENT_AMBULATORY_CARE_PROVIDER_SITE_OTHER): Payer: 59 | Admitting: Podiatry

## 2023-08-10 ENCOUNTER — Ambulatory Visit (INDEPENDENT_AMBULATORY_CARE_PROVIDER_SITE_OTHER): Payer: 59

## 2023-08-10 DIAGNOSIS — T148XXA Other injury of unspecified body region, initial encounter: Secondary | ICD-10-CM

## 2023-08-10 DIAGNOSIS — M79672 Pain in left foot: Secondary | ICD-10-CM

## 2023-08-10 DIAGNOSIS — M7662 Achilles tendinitis, left leg: Secondary | ICD-10-CM | POA: Diagnosis not present

## 2023-08-10 MED ORDER — MELOXICAM 15 MG PO TABS: 15.0000 mg | ORAL_TABLET | Freq: Every day | ORAL | 2 refills | Status: DC

## 2023-08-10 MED ORDER — TRIAMCINOLONE ACETONIDE 10 MG/ML IJ SUSP
10.0000 mg | Freq: Once | INTRAMUSCULAR | Status: AC
  Administered 2023-08-10: 10 mg via INTRA_ARTICULAR

## 2023-08-11 NOTE — Progress Notes (Signed)
Subjective:   Patient ID: Janet Garrison, female   DOB: 47 y.o.   MRN: [id number]   HPI Patient presents stating the outside of her left back of her heel is really hurting her again and it did do well for a number of months   ROS      Objective:  Physical Exam  Neurovascular status intact with the patient found to have inflammation in the lateral side of the Achilles tendon insertion with no central or medial pain noted.  Patient did get relief for around 6 months     Assessment:  Achilles tendinitis which is reoccurred left which overall we have been able to do okay with with no indications of significant pathology on MRI     Plan:  H&P reviewed I do think that we could consider continued conservative care and she agrees and I went ahead today and I did explain risk of injection and rupture.  Patient needs to be immobilized and did move and lost her boot so I did dispense a new boot today given this situation and the fact she needs this for this condition and I did sterile prep and injected the outside of the left Achilles not in the center or medial 3 mg dexamethasone Kenalog 5 mg Xylocaine.  The boot was fitted well to her lower leg and gave her good immobilization

## 2023-09-22 ENCOUNTER — Encounter: Payer: Self-pay | Admitting: Podiatry

## 2023-09-22 ENCOUNTER — Ambulatory Visit: Payer: 59 | Admitting: Podiatry

## 2023-09-22 DIAGNOSIS — M7662 Achilles tendinitis, left leg: Secondary | ICD-10-CM

## 2023-09-22 NOTE — Progress Notes (Signed)
Subjective:   Patient ID: Janet Garrison, female   DOB: 47 y.o.   MRN: [id number]   HPI Patient states she seems to be improving but still having discomfort in the posterior heel region   ROS      Objective:  Physical Exam  Neurovascular status intact with inflammation posterior heel that is improved but still present upon deep palpation with boot that patient is wearing     Assessment:  Patient is improving with utilization of boot     Plan:  H&P reviewed recommended the continuation of boot gradual reduction over the next 4 weeks with stretching exercises ice therapy heel lift and patient will be seen back to recheck as needed

## 2023-12-05 ENCOUNTER — Other Ambulatory Visit: Payer: Self-pay | Admitting: Podiatry

## 2023-12-08 ENCOUNTER — Ambulatory Visit: Payer: 59 | Admitting: Nurse Practitioner

## 2024-01-02 ENCOUNTER — Encounter: Payer: Self-pay | Admitting: Nurse Practitioner

## 2024-01-02 ENCOUNTER — Ambulatory Visit (INDEPENDENT_AMBULATORY_CARE_PROVIDER_SITE_OTHER): Payer: 59 | Admitting: Nurse Practitioner

## 2024-01-02 VITALS — BP 120/80 | HR 88 | Temp 98.2°F | Ht 60.0 in | Wt 200.0 lb

## 2024-01-02 DIAGNOSIS — E6609 Other obesity due to excess calories: Secondary | ICD-10-CM | POA: Diagnosis not present

## 2024-01-02 DIAGNOSIS — E66812 Obesity, class 2: Secondary | ICD-10-CM | POA: Diagnosis not present

## 2024-01-02 DIAGNOSIS — M25572 Pain in left ankle and joints of left foot: Secondary | ICD-10-CM | POA: Diagnosis not present

## 2024-01-02 DIAGNOSIS — Z1211 Encounter for screening for malignant neoplasm of colon: Secondary | ICD-10-CM

## 2024-01-02 DIAGNOSIS — E669 Obesity, unspecified: Secondary | ICD-10-CM

## 2024-01-02 DIAGNOSIS — Z6839 Body mass index (BMI) 39.0-39.9, adult: Secondary | ICD-10-CM

## 2024-01-02 DIAGNOSIS — M25579 Pain in unspecified ankle and joints of unspecified foot: Secondary | ICD-10-CM | POA: Insufficient documentation

## 2024-01-02 DIAGNOSIS — Z13220 Encounter for screening for lipoid disorders: Secondary | ICD-10-CM

## 2024-01-02 DIAGNOSIS — Z1231 Encounter for screening mammogram for malignant neoplasm of breast: Secondary | ICD-10-CM | POA: Insufficient documentation

## 2024-01-02 NOTE — Assessment & Plan Note (Signed)
 Diclofenac tabs, voltaren gel, biofreeze and meloxicam have all been ineffective in addition to steroid injection by Podiatry.  I have advised her to f/u with Podiatry for next steps.

## 2024-01-02 NOTE — Assessment & Plan Note (Signed)
 Pt instructed on Self Breast Exam.According to ACOG guidelines Women aged 50 and older are recommended to get an annual mammogram. Order placed for mammogram She is aware if they do not contact her in 7-10 business days to return call to office. Pt encouraged to schedule annual mammogram

## 2024-01-02 NOTE — Assessment & Plan Note (Signed)
 According to USPTF Colorectal cancer Screening guidelines. Colonoscopy is recommended every 10 years, starting at age 50 years. I placed a referral in 2023 as well she did not go for appt.  Will refer to GI for colon cancer screening.

## 2024-01-02 NOTE — Progress Notes (Signed)
 I,Jameka J Llittleton, CMA,acting as a Neurosurgeon for [employer], FNP.,have documented all relevant documentation on the behalf of Arnette Felts, FNP,as directed by  Arnette Felts, FNP while in the presence of Arnette Felts, FNP.  Subjective:  Patient ID: Janet Garrison , female    DOB: 11/28/1975 , 48 y.o.   MRN: [id number]  Chief Complaint  Patient presents with   Hip Pain    HPI  Patient presents today for left hip pain. She reports she was having left hip pain. Patient reports she took aleve and that helped her feel better. She also reports having ankle pain. She had plantar fascitiis to her left foot. She has not went back to podiatry since. Has been taking diclofenac two tabs and has been wearing a boot half days. Continues to have discomfort to her foot. She has also used voltaren gel and biofreeze, and meloxicam.   Hip Pain  The incident occurred more than 1 week ago (3 weeks ago). There was no injury mechanism. The pain is present in the left hip. The quality of the pain is described as aching. She has tried acetaminophen and NSAIDs for the symptoms. The treatment provided significant relief.     Past Medical History:  Diagnosis Date   Medical history non-contributory      Family History  Problem Relation Age of Onset   Hypertension Mother    Cancer Father        bladder     Current Outpatient Medications:    diclofenac (VOLTAREN) 75 MG EC tablet, TAKE 1 TABLET(75 MG) BY MOUTH TWICE DAILY, Disp: 50 tablet, Rfl: 2   No Known Allergies   Review of Systems  Constitutional: Negative.   Respiratory: Negative.    Cardiovascular: Negative.   Musculoskeletal: Negative.   Neurological: Negative.   Psychiatric/Behavioral: Negative.       Today's Vitals   01/02/24 1403  BP: 120/80  Pulse: 88  Temp: 98.2 F (36.8 C)  TempSrc: Oral  Weight: 200 lb (90.7 kg)  Height: 5' (1.524 m)  PainSc: 8   PainLoc: Ankle   Body mass index is 39.06 kg/m.  Wt Readings from Last 3  Encounters:  01/02/24 200 lb (90.7 kg)  04/06/22 210 lb 12.8 oz (95.6 kg)  10/13/21 207 lb 6.4 oz (94.1 kg)    Objective:  Physical Exam Vitals and nursing note reviewed.  Constitutional:      General: She is not in acute distress.    Appearance: Normal appearance. She is obese.  Cardiovascular:     Rate and Rhythm: Normal rate and regular rhythm.     Pulses: Normal pulses.     Heart sounds: Normal heart sounds. No murmur heard. Pulmonary:     Effort: Pulmonary effort is normal. No respiratory distress.  Skin:    General: Skin is warm and dry.     Capillary Refill: Capillary refill takes less than 2 seconds.  Neurological:     General: No focal deficit present.     Mental Status: She is alert and oriented to person, place, and time.  Psychiatric:        Mood and Affect: Mood normal.        Behavior: Behavior normal.        Thought Content: Thought content normal.        Judgment: Judgment normal.       Assessment And Plan:  Class 2 obesity due to excess calories without serious comorbidity with body mass index (BMI) of 39.0  to 39.9 in adult  Acute left ankle pain Assessment & Plan: Diclofenac tabs, voltaren gel, biofreeze and meloxicam have all been ineffective in addition to steroid injection by Podiatry.  I have advised her to f/u with Podiatry for next steps.   Orders: -     CMP14+EGFR -     CBC  Encounter for screening mammogram for breast cancer Assessment & Plan: Pt instructed on Self Breast Exam.According to ACOG guidelines Women aged 62 and older are recommended to get an annual mammogram. Order placed for mammogram She is aware if they do not contact her in 7-10 business days to return call to office. Pt encouraged to schedule annual mammogram   Orders: -     Digital Screening Mammogram, Left and Right; Future  Encounter for screening colonoscopy -     Ambulatory referral to Gastroenterology  Obesity (BMI 30-39.9) -     Hemoglobin A1c -      CBC  Encounter for screening for lipid disorder -     Lipid panel   Return if symptoms worsen or fail to improve.  Patient was given opportunity to ask questions. Patient verbalized understanding of the plan and was able to repeat key elements of the plan. All questions were answered to their satisfaction.    Jeanell Sparrow, FNP, have reviewed all documentation for this visit. The documentation on 01/02/24 for the exam, diagnosis, procedures, and orders are all accurate and complete.   IF YOU HAVE BEEN REFERRED TO A SPECIALIST, IT MAY TAKE 1-2 WEEKS TO SCHEDULE/PROCESS THE REFERRAL. IF YOU HAVE NOT HEARD FROM US/SPECIALIST IN TWO WEEKS, PLEASE GIVE [place] A CALL AT [phone].

## 2024-01-03 ENCOUNTER — Encounter: Payer: Self-pay | Admitting: Nurse Practitioner

## 2024-01-03 LAB — CMP14+EGFR
ALT: 23 IU/L (ref 0–32)
AST: 17 IU/L (ref 0–40)
Albumin: 4.4 g/dL (ref 3.9–4.9)
Alkaline Phosphatase: 144 IU/L — ABNORMAL HIGH (ref 44–121)
BUN/Creatinine Ratio: 16 (ref 9–23)
BUN: 20 mg/dL (ref 6–24)
Bilirubin Total: <0.2 mg/dL (ref 0.0–1.2)
CO2: 24 mmol/L (ref 20–29)
Calcium: 9.9 mg/dL (ref 8.7–10.2)
Chloride: 104 mmol/L (ref 96–106)
Creatinine, Ser: 1.24 mg/dL — ABNORMAL HIGH (ref 0.57–1.00)
Globulin, Total: 2.2 g/dL (ref 1.5–4.5)
Glucose: 89 mg/dL (ref 70–99)
Potassium: 4 mmol/L (ref 3.5–5.2)
Sodium: 144 mmol/L (ref 134–144)
Total Protein: 6.6 g/dL (ref 6.0–8.5)
eGFR: 53 mL/min/{1.73_m2} — ABNORMAL LOW (ref >59)

## 2024-01-03 LAB — CBC
Hematocrit: 39 % (ref 34.0–46.6)
Hemoglobin: 13.1 g/dL (ref 11.1–15.9)
MCH: 30.7 pg (ref 26.6–33.0)
MCHC: 33.6 g/dL (ref 31.5–35.7)
MCV: 91 fL (ref 79–97)
Platelets: 376 10*3/uL (ref 150–450)
RBC: 4.27 x10E6/uL (ref 3.77–5.28)
RDW: 12.2 % (ref 11.7–15.4)
WBC: 5.7 10*3/uL (ref 3.4–10.8)

## 2024-01-03 LAB — HEMOGLOBIN A1C
Est. average glucose Bld gHb Est-mCnc: 117 mg/dL
Hgb A1c MFr Bld: 5.7 % — ABNORMAL HIGH (ref 4.8–5.6)

## 2024-01-03 LAB — LIPID PANEL
Chol/HDL Ratio: 5.3 ratio — ABNORMAL HIGH (ref 0.0–4.4)
Cholesterol, Total: 252 mg/dL — ABNORMAL HIGH (ref 100–199)
HDL: 48 mg/dL (ref >39)
LDL Chol Calc (NIH): 184 mg/dL — ABNORMAL HIGH (ref 0–99)
Triglycerides: 111 mg/dL (ref 0–149)
VLDL Cholesterol Cal: 20 mg/dL (ref 5–40)

## 2024-01-19 ENCOUNTER — Other Ambulatory Visit: Payer: Self-pay | Admitting: Nurse Practitioner

## 2024-01-19 DIAGNOSIS — Z1231 Encounter for screening mammogram for malignant neoplasm of breast: Secondary | ICD-10-CM

## 2024-01-23 ENCOUNTER — Ambulatory Visit
Admission: RE | Admit: 2024-01-23 | Discharge: 2024-01-23 | Disposition: A | Discharge status: Home / Self Care | Source: Ambulatory Visit

## 2024-01-23 DIAGNOSIS — Z1231 Encounter for screening mammogram for malignant neoplasm of breast: Secondary | ICD-10-CM

## 2024-02-01 ENCOUNTER — Encounter: Payer: Self-pay | Admitting: Nurse Practitioner

## 2024-02-01 ENCOUNTER — Ambulatory Visit (INDEPENDENT_AMBULATORY_CARE_PROVIDER_SITE_OTHER): Admitting: Nurse Practitioner

## 2024-02-01 VITALS — BP 130/80 | HR 88 | Temp 97.9°F | Ht 62.0 in | Wt 202.8 lb

## 2024-02-01 DIAGNOSIS — E66812 Obesity, class 2: Secondary | ICD-10-CM

## 2024-02-01 DIAGNOSIS — M25511 Pain in right shoulder: Secondary | ICD-10-CM | POA: Diagnosis not present

## 2024-02-01 DIAGNOSIS — Z6837 Body mass index (BMI) 37.0-37.9, adult: Secondary | ICD-10-CM

## 2024-02-01 DIAGNOSIS — E6609 Other obesity due to excess calories: Secondary | ICD-10-CM

## 2024-02-01 DIAGNOSIS — Z2821 Immunization not carried out because of patient refusal: Secondary | ICD-10-CM

## 2024-02-01 DIAGNOSIS — M79641 Pain in right hand: Secondary | ICD-10-CM

## 2024-02-01 NOTE — Progress Notes (Signed)
 Del Favia, CMA,acting as a Neurosurgeon for Susanna Epley, FNP.,have documented all relevant documentation on the behalf of Susanna Epley, FNP,as directed by  Susanna Epley, FNP while in the presence of Susanna Epley, FNP.  Subjective:  Patient ID: Janet Garrison , female    DOB: September 05, 1976 , 48 y.o.   MRN: [id number]  Chief Complaint  Patient presents with   Hand Pain    HPI  Patient presents today for pain in her right hand that sometimes travels up her arm, she reports she sometimes can't even lift her arm. Patient stated it started in her right shoulder. Patient is unsure of what causes the shoulder or hand pain.   The pain started on Sunday, she is having weakness in the right hand that causes her to drop objects that are not heavy.  She works for [employer] and  carries heavy book bag throughout her day, and experiences increased pain when carrying on right side.  The pain is sharp, radiates from elbow to hand.  She has used Aleve  for the pain and this decreases the pain but it does not go away.  The pain is worst as her work day proceeds, relieved with immobility, not affecting her sleep.  The pain is increased with elevation of the right shoulder.       Past Medical History:  Diagnosis Date   Medical history non-contributory      Family History  Problem Relation Age of Onset   Hypertension Mother    Cancer Father        bladder     Current Outpatient Medications:    diclofenac  (VOLTAREN ) 75 MG EC tablet, TAKE 1 TABLET(75 MG) BY MOUTH TWICE DAILY, Disp: 50 tablet, Rfl: 2   meloxicam  (MOBIC ) 7.5 MG tablet, Take one tablet daily by mouth for 5 days, then as needed for pain, Disp: 30 tablet, Rfl: 1   No Known Allergies   Review of Systems  Constitutional:  Negative for chills, fatigue and fever.  Respiratory:  Negative for shortness of breath.   Cardiovascular:  Negative for chest pain.  Musculoskeletal:  Positive for arthralgias (right arm pain radiating from  elbow to wrist).  Neurological:  Positive for weakness (right hand/wrist) and numbness (right hand/wrist).     Today's Vitals   02/01/24 0948  BP: 130/80  Pulse: 88  Temp: 97.9 F (36.6 C)  TempSrc: Oral  Weight: 202 lb 12.8 oz (92 kg)  Height: 5\' 2"  (1.575 m)  PainSc: 8   PainLoc: Shoulder   Body mass index is 37.09 kg/m.  Wt Readings from Last 3 Encounters:  02/01/24 202 lb 12.8 oz (92 kg)  01/02/24 200 lb (90.7 kg)  04/06/22 210 lb 12.8 oz (95.6 kg)    Objective:  Physical Exam Constitutional:      Appearance: Normal appearance. She is obese.  HENT:     Head: Normocephalic and atraumatic.     Right Ear: External ear normal.     Left Ear: External ear normal.  Cardiovascular:     Rate and Rhythm: Normal rate and regular rhythm.     Pulses: Normal pulses.     Heart sounds: Normal heart sounds.  Pulmonary:     Effort: Pulmonary effort is normal.     Breath sounds: Normal breath sounds.  Musculoskeletal:        General: Tenderness (right forarm and wrist, Positve Phalen's sign) present.  Skin:    General: Skin is warm and dry.  Capillary Refill: Capillary refill takes less than 2 seconds.  Neurological:     General: No focal deficit present.     Mental Status: She is alert and oriented to person, place, and time.  Psychiatric:        Mood and Affect: Mood normal.        Behavior: Behavior normal.        Thought Content: Thought content normal.         Assessment And Plan:  Pain of right hand Assessment & Plan: She has tenderness to her right forearm and wrist with a positive Phalen sign.  Will treat with meloxicam  if not better return call to office.   Acute pain of right shoulder Assessment & Plan: Will see if the meloxicam  will be beneficial.  May be some osteoarthritis or bursitis.   COVID-19 vaccination declined Assessment & Plan: Declines covid 19 vaccine. Discussed risk of covid 89 and if she changes her mind about the vaccine to call the  office. Education has been provided regarding the importance of this vaccine but patient still declined. Advised may receive this vaccine at local pharmacy or Health Dept.or vaccine clinic. Aware to provide a copy of the vaccination record if obtained from local pharmacy or Health Dept.  Encouraged to take multivitamin, vitamin d, vitamin c and zinc to increase immune system. Aware can call office if would like to have vaccine here at office. Verbalized acceptance and understanding.    Class 2 obesity due to excess calories without serious comorbidity with body mass index (BMI) of 37.0 to 37.9 in adult Assessment & Plan: She is encouraged to strive for BMI less than 30 to decrease cardiac risk. Advised to aim for at least 150 minutes of exercise per week.    Other orders -     Meloxicam ; Take one tablet daily by mouth for 5 days, then as needed for pain  Dispense: 30 tablet; Refill: 1    Return for keep same next.  Patient was given opportunity to ask questions. Patient verbalized understanding of the plan and was able to repeat key elements of the plan. All questions were answered to their satisfaction.   I have reviewed this encounter including the documentation in this note and/or discussed this patient with Mickael Alamo FNP Student. I am certifying that I agree with the content of this note as the primary care nurse practitioner.  Susanna Epley, DNP, FNP-BC  I, Susanna Epley, FNP, have reviewed all documentation for this visit. The documentation on 02/01/24 for the exam, diagnosis, procedures, and orders are all accurate and complete.   IF YOU HAVE BEEN REFERRED TO A SPECIALIST, IT MAY TAKE 1-2 WEEKS TO SCHEDULE/PROCESS THE REFERRAL. IF YOU HAVE NOT HEARD FROM US /SPECIALIST IN TWO WEEKS, PLEASE GIVE US  A CALL AT [phone].

## 2024-02-01 NOTE — Patient Instructions (Addendum)
 Purchase a wrist splint from your pharmacy, wear during the day when you are moving the most.    Call to office if not better in 5-7 days.    Dr. Eilleen Grates - call [phone] to schedule colonoscopy    Your cholesterol levels have increased significantly, cut back on fried and fatty foods. Increase your fiber intake. HgbA1c is up to 5.7 this is prediabetes cut back on sugary foods and drinks. Kidney functions have decreased are you staying well hydrated with water at least 64 oz daily? Alkaline phos is up cut back on processed foods. Increase intake of cruciferous vegetables

## 2024-02-02 MED ORDER — MELOXICAM 7.5 MG PO TABS
ORAL_TABLET | ORAL | 1 refills | Status: DC
Start: 2024-02-02 — End: 2024-05-21

## 2024-02-13 ENCOUNTER — Encounter: Payer: Self-pay | Admitting: Nurse Practitioner

## 2024-02-13 DIAGNOSIS — Z2821 Immunization not carried out because of patient refusal: Secondary | ICD-10-CM | POA: Insufficient documentation

## 2024-02-13 DIAGNOSIS — M25511 Pain in right shoulder: Secondary | ICD-10-CM | POA: Insufficient documentation

## 2024-02-13 DIAGNOSIS — M79641 Pain in right hand: Secondary | ICD-10-CM | POA: Insufficient documentation

## 2024-02-13 NOTE — Assessment & Plan Note (Signed)

## 2024-02-13 NOTE — Assessment & Plan Note (Signed)
 Will see if the meloxicam  will be beneficial.  May be some osteoarthritis or bursitis.

## 2024-02-13 NOTE — Assessment & Plan Note (Signed)
 She has tenderness to her right forearm and wrist with a positive Phalen sign.  Will treat with meloxicam  if not better return call to office.

## 2024-02-13 NOTE — Assessment & Plan Note (Signed)
 She is encouraged to strive for BMI less than 30 to decrease cardiac risk. Advised to aim for at least 150 minutes of exercise per week.

## 2024-03-14 ENCOUNTER — Encounter: Admitting: Nurse Practitioner

## 2024-03-14 NOTE — Progress Notes (Deleted)
 Del Favia, CMA,acting as a Neurosurgeon for Susanna Epley, FNP.,have documented all relevant documentation on the behalf of Susanna Epley, FNP,as directed by  Susanna Epley, FNP while in the presence of Susanna Epley, FNP.  Subjective:    Patient ID: Janet Garrison , female    DOB: 04-Feb-1976 , 48 y.o.   MRN: [id number]  No chief complaint on file.   HPI  HPI   Past Medical History:  Diagnosis Date   Medical history non-contributory      Family History  Problem Relation Age of Onset   Hypertension Mother    Cancer Father        bladder     Current Outpatient Medications:    diclofenac  (VOLTAREN ) 75 MG EC tablet, TAKE 1 TABLET(75 MG) BY MOUTH TWICE DAILY, Disp: 50 tablet, Rfl: 2   meloxicam  (MOBIC ) 7.5 MG tablet, Take one tablet daily by mouth for 5 days, then as needed for pain, Disp: 30 tablet, Rfl: 1   No Known Allergies    The patient states she uses {contraceptive methods:5051} for birth control. No LMP recorded.. {Dysmenorrhea-menorrhagia:21918}. Negative for: breast discharge, breast lump(s), breast pain and breast self exam. Associated symptoms include abnormal vaginal bleeding. Pertinent negatives include abnormal bleeding (hematology), anxiety, decreased libido, depression, difficulty falling sleep, dyspareunia, history of infertility, nocturia, sexual dysfunction, sleep disturbances, urinary incontinence, urinary urgency, vaginal discharge and vaginal itching. Diet regular.The patient states her exercise level is    . The patient's tobacco use is:  Social History   Tobacco Use  Smoking Status Never   Passive exposure: Yes  Smokeless Tobacco Never  . She has been exposed to passive smoke. The patient's alcohol use is:  Social History   Substance and Sexual Activity  Alcohol Use Yes   Comment: occasional  . Additional information: Last pap ***, next one scheduled for ***.    Review of Systems   There were no vitals filed for this visit. There is no height  or weight on file to calculate BMI.  Wt Readings from Last 3 Encounters:  02/01/24 202 lb 12.8 oz (92 kg)  01/02/24 200 lb (90.7 kg)  04/06/22 210 lb 12.8 oz (95.6 kg)     Objective:  Physical Exam      Assessment And Plan:     Encounter for annual health examination     No follow-ups on file. Patient was given opportunity to ask questions. Patient verbalized understanding of the plan and was able to repeat key elements of the plan. All questions were answered to their satisfaction.   Susanna Epley, FNP  I, Susanna Epley, FNP, have reviewed all documentation for this visit. The documentation on 03/14/24 for the exam, diagnosis, procedures, and orders are all accurate and complete.

## 2024-05-16 ENCOUNTER — Other Ambulatory Visit: Payer: Self-pay | Admitting: Nurse Practitioner

## 2024-09-10 ENCOUNTER — Ambulatory Visit: Payer: Self-pay

## 2024-09-10 NOTE — Telephone Encounter (Signed)
 FYI Only or Action Required?: FYI only for provider: appointment scheduled on 09/11/24.  Patient was last seen in primary care on 02/01/2024 by Georgina Speaks, FNP.  Called Nurse Triage reporting Urinary Frequency.  Symptoms began several days ago.  Interventions attempted: Nothing.  Symptoms are: unchanged.  Triage Disposition: See Physician Within 24 Hours  Patient/caregiver understands and will follow disposition?: Yes   Copied from CRM #[id number]. Topic: Clinical - Red Word Triage >> Sep 10, 2024 10:39 AM Tinnie BROCKS wrote: Red Word that prompted transfer to Nurse Triage: Possible UTI, severe pain on urination, blood in urine. #[id number] Reason for Disposition  Urinating more frequently than usual (i.e., frequency) OR new-onset of the feeling of an urgent need to urinate (i.e., urgency)  Answer Assessment - Initial Assessment Questions Patient reports seeing blood once in her urine on Saturday, urine cloudy, 9/10 pain when urinating, no abd swelling/pain. She says she's not drinking much due to the pain with urination, also having urgency and not peeing a lot. Drank 2-3 8oz bottles water yesterday in a 12 hour period. Today a little coffee, cranberry juice and tea, not a lot of each she says. Advised no availability with PCP, scheduled with a different provider tomorrow.    1. SYMPTOM: What's the main symptom you're concerned about? (e.g., frequency, incontinence)     Frequency, urgency  2. ONSET: When did the symptoms  start?     Friday night  3. PAIN: Is there any pain? If Yes, ask: How bad is it? (Scale: 1-10; mild, moderate, severe)     9 when urinating  4. CAUSE: What do you think is causing the symptoms?     UTI  5. OTHER SYMPTOMS: Do you have any other symptoms? (e.g., blood in urine, fever, flank pain, pain with urination)     Blood once on Saturday, lower back pain Wednesday, cloudy urine  6. PREGNANCY: Is there any chance you are pregnant? When was your  last menstrual period?     No  Protocols used: Urinary Symptoms-A-AH

## 2024-09-11 ENCOUNTER — Ambulatory Visit: Admitting: Internal Medicine

## 2024-09-11 ENCOUNTER — Encounter: Payer: Self-pay | Admitting: Internal Medicine

## 2024-09-11 VITALS — BP 122/80 | Temp 98.3°F | Ht 62.0 in | Wt 203.8 lb

## 2024-09-11 DIAGNOSIS — Z1211 Encounter for screening for malignant neoplasm of colon: Secondary | ICD-10-CM | POA: Diagnosis not present

## 2024-09-11 DIAGNOSIS — N3001 Acute cystitis with hematuria: Secondary | ICD-10-CM

## 2024-09-11 LAB — POCT URINALYSIS DIPSTICK
Bilirubin, UA: NEGATIVE
Glucose, UA: NEGATIVE
Nitrite, UA: POSITIVE
Protein, UA: POSITIVE — AB
Spec Grav, UA: 1.025 (ref 1.010–1.025)
Urobilinogen, UA: 0.2 U/dL
pH, UA: 6 (ref 5.0–8.0)

## 2024-09-11 MED ORDER — PHENAZOPYRIDINE HCL 100 MG PO TABS: 100.0000 mg | ORAL_TABLET | Freq: Three times a day (TID) | ORAL | 0 refills | Status: AC | PRN

## 2024-09-11 MED ORDER — NITROFURANTOIN MONOHYD MACRO 100 MG PO CAPS: 100.0000 mg | ORAL_CAPSULE | Freq: Two times a day (BID) | ORAL | 0 refills | Status: AC

## 2024-09-11 NOTE — Patient Instructions (Signed)

## 2024-09-11 NOTE — Progress Notes (Signed)
 I,Victoria T Emmitt, CMA,acting as a neurosurgeon for Catheryn LOISE Slocumb, MD.,have documented all relevant documentation on the behalf of Catheryn LOISE Slocumb, MD,as directed by  Catheryn LOISE Slocumb, MD while in the presence of Catheryn LOISE Slocumb, MD.  Subjective:  Patient ID: Janet Garrison , female    DOB: [birth year] , 48 y.o.   MRN: [id number]  Chief Complaint  Patient presents with   Urinary Frequency    Patient presents today for urinary frequency. This initially started Friday. She also experiences cloudy urine & lower back pain, pain during urination.   She has just tried cranberry juice & water.     HPI Discussed the use of AI scribe software for clinical note transcription with the patient, who gave verbal consent to proceed.  History of Present Illness Janet Garrison is a 48 year old female who presents with symptoms of a bladder infection.  Symptoms began last week with mild dysuria, escalating to severe pain rated as ten out of ten. She has been limiting fluid intake due to pain but recognizes the importance of hydration. No vaginal discharge is present.  She recalls a previous bladder infection approximately 20-22 years ago during pregnancy but has not experienced one since. She has been consuming cranberry juice to alleviate symptoms.  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  She works at [employer] and lives five minutes away from the clinic.   Urinary Frequency  This is a new problem. The current episode started in the past 7 days. The problem occurs every urination. The problem has been gradually worsening. The pain is at a severity of 9/10. The pain is severe. There has been no fever. Associated symptoms include frequency.     Past Medical History:  Diagnosis Date   Medical history non-contributory      Family History  Problem Relation Age of Onset   Hypertension Mother    Cancer Father        bladder     Current Outpatient Medications:     diclofenac  (VOLTAREN ) 75 MG EC tablet, TAKE 1 TABLET(75 MG) BY MOUTH TWICE DAILY, Disp: 50 tablet, Rfl: 2   meloxicam  (MOBIC ) 7.5 MG tablet, TAKE 1 TABLET BY MOUTH DAILY FOR 5 DAYS THEN AS NEEDED FOR PAIN, Disp: 30 tablet, Rfl: 1   nitrofurantoin , macrocrystal-monohydrate, (MACROBID ) 100 MG capsule, Take 1 capsule (100 mg total) by mouth 2 (two) times daily for 5 days., Disp: 10 capsule, Rfl: 0   phenazopyridine (PYRIDIUM) 100 MG tablet, Take 1 tablet (100 mg total) by mouth 3 (three) times daily as needed for pain., Disp: 6 tablet, Rfl: 0   No Known Allergies   Review of Systems  Constitutional: Negative.   Respiratory: Negative.    Cardiovascular: Negative.   Genitourinary:  Positive for dysuria and frequency.  Neurological: Negative.   Psychiatric/Behavioral: Negative.       Today's Vitals   09/11/24 1128  BP: 122/80  Temp: 98.3 F (36.8 C)  SpO2: 98%  Weight: 203 lb 12.8 oz (92.4 kg)  Height: 5' 2 (1.575 m)   Body mass index is 37.28 kg/m.  Wt Readings from Last 3 Encounters:  09/11/24 203 lb 12.8 oz (92.4 kg)  02/01/24 202 lb 12.8 oz (92 kg)  01/02/24 200 lb (90.7 kg)    The 10-year ASCVD risk score (Arnett DK, et al., 2019) is: 2.4%   Values used to calculate the score:     Age: 89 years  Clincally relevant sex: Female     Is Non-Hispanic African American: Yes     Diabetic: No     Tobacco smoker: No     Systolic Blood Pressure: 122 mmHg     Is BP treated: No     HDL Cholesterol: 48 mg/dL     Total Cholesterol: 252 mg/dL  Objective:  Physical Exam Vitals and nursing note reviewed.  Constitutional:      Appearance: Normal appearance.  HENT:     Head: Normocephalic and atraumatic.  Eyes:     Extraocular Movements: Extraocular movements intact.  Cardiovascular:     Rate and Rhythm: Normal rate and regular rhythm.     Heart sounds: Normal heart sounds.  Pulmonary:     Effort: Pulmonary effort is normal.     Breath sounds: Normal breath sounds.   Abdominal:     Palpations: Abdomen is soft.     Comments: No suprapubic tenderness or flank tenderness  Musculoskeletal:     Cervical back: Normal range of motion.  Skin:    General: Skin is warm.  Neurological:     General: No focal deficit present.     Mental Status: She is alert.  Psychiatric:        Mood and Affect: Mood normal.        Behavior: Behavior normal.   +      Assessment And Plan:   Assessment & Plan Acute cystitis with hematuria Acute cystitis with severe dysuria, no need for urine culture due to infrequency of infections. - Prescribed Macrobid  (nitrofurantoin ) twice daily for 5 days. - Prescribed pyridium (phenazopyridine) three times daily as needed for up to 2-3 days. - Advised to continue drinking cranberry juice. - Instructed to call if not feeling better by Thursday. - Advised to reset MyChart for communication. Screen for colon cancer Due for screening. Previous referral not followed up due to retirement of Dr. Luis. - Referred to Dr. Kristie, GI doctor next door, for colorectal cancer screening.  Orders Placed This Encounter  Procedures   Ambulatory referral to Gastroenterology   POCT Urinalysis Dipstick (18997)     Return 1-2 months physical w/ myself or JM.  Patient was given opportunity to ask questions. Patient verbalized understanding of the plan and was able to repeat key elements of the plan. All questions were answered to their satisfaction.    I, Catheryn LOISE Slocumb, MD, have reviewed all documentation for this visit. The documentation on 09/11/24 for the exam, diagnosis, procedures, and orders are all accurate and complete.   IF YOU HAVE BEEN REFERRED TO A SPECIALIST, IT MAY TAKE 1-2 WEEKS TO SCHEDULE/PROCESS THE REFERRAL. IF YOU HAVE NOT HEARD FROM US /SPECIALIST IN TWO WEEKS, PLEASE GIVE US  A CALL AT [phone] X [phone].

## 2024-11-13 ENCOUNTER — Encounter: Admitting: Nurse Practitioner

## 2025-02-20 ENCOUNTER — Encounter: Admitting: Nurse Practitioner
